# Patient Record
Sex: Male | Born: 1945 | Race: White | Hispanic: No | Marital: Married | Smoking: Former smoker
Health system: Southern US, Community
[De-identification: ages and names within clinical notes are randomized; demographics above are authoritative.]

## PROBLEM LIST (undated history)

## (undated) DIAGNOSIS — F32A Depression, unspecified: Secondary | ICD-10-CM

## (undated) DIAGNOSIS — K5790 Diverticulosis of intestine, part unspecified, without perforation or abscess without bleeding: Secondary | ICD-10-CM

## (undated) DIAGNOSIS — H269 Unspecified cataract: Secondary | ICD-10-CM

## (undated) DIAGNOSIS — E039 Hypothyroidism, unspecified: Secondary | ICD-10-CM

## (undated) DIAGNOSIS — E785 Hyperlipidemia, unspecified: Secondary | ICD-10-CM

## (undated) DIAGNOSIS — R911 Solitary pulmonary nodule: Secondary | ICD-10-CM

## (undated) DIAGNOSIS — M1990 Unspecified osteoarthritis, unspecified site: Secondary | ICD-10-CM

## (undated) DIAGNOSIS — K449 Diaphragmatic hernia without obstruction or gangrene: Secondary | ICD-10-CM

## (undated) DIAGNOSIS — G4730 Sleep apnea, unspecified: Secondary | ICD-10-CM

## (undated) DIAGNOSIS — K219 Gastro-esophageal reflux disease without esophagitis: Secondary | ICD-10-CM

## (undated) DIAGNOSIS — F4310 Post-traumatic stress disorder, unspecified: Secondary | ICD-10-CM

## (undated) DIAGNOSIS — Z77098 Contact with and (suspected) exposure to other hazardous, chiefly nonmedicinal, chemicals: Secondary | ICD-10-CM

## (undated) DIAGNOSIS — N419 Inflammatory disease of prostate, unspecified: Secondary | ICD-10-CM

## (undated) DIAGNOSIS — F329 Major depressive disorder, single episode, unspecified: Secondary | ICD-10-CM

## (undated) DIAGNOSIS — C49A0 Gastrointestinal stromal tumor, unspecified site: Secondary | ICD-10-CM

## (undated) DIAGNOSIS — E119 Type 2 diabetes mellitus without complications: Secondary | ICD-10-CM

## (undated) HISTORY — DX: Diverticulosis of intestine, part unspecified, without perforation or abscess without bleeding: K57.90

## (undated) HISTORY — DX: Sleep apnea, unspecified: G47.30

## (undated) HISTORY — DX: Diaphragmatic hernia without obstruction or gangrene: K44.9

## (undated) HISTORY — DX: Type 2 diabetes mellitus without complications: E11.9

## (undated) HISTORY — DX: Contact with and (suspected) exposure to other hazardous, chiefly nonmedicinal, chemicals: Z77.098

## (undated) HISTORY — DX: Unspecified cataract: H26.9

## (undated) HISTORY — DX: Hypothyroidism, unspecified: E03.9

## (undated) HISTORY — PX: APPENDECTOMY: SHX54

## (undated) HISTORY — DX: Gastro-esophageal reflux disease without esophagitis: K21.9

## (undated) HISTORY — DX: Post-traumatic stress disorder, unspecified: F43.10

## (undated) HISTORY — DX: Inflammatory disease of prostate, unspecified: N41.9

## (undated) HISTORY — DX: Gastrointestinal stromal tumor, unspecified site: C49.A0

## (undated) HISTORY — DX: Major depressive disorder, single episode, unspecified: F32.9

## (undated) HISTORY — DX: Unspecified osteoarthritis, unspecified site: M19.90

## (undated) HISTORY — DX: Hyperlipidemia, unspecified: E78.5

## (undated) HISTORY — DX: Depression, unspecified: F32.A

## (undated) HISTORY — DX: Solitary pulmonary nodule: R91.1

---

## 2000-09-09 ENCOUNTER — Encounter: Payer: Self-pay | Admitting: Emergency Medicine

## 2000-09-09 ENCOUNTER — Emergency Department (HOSPITAL_COMMUNITY): Admission: EM | Admit: 2000-09-09 | Discharge: 2000-09-09 | Payer: Self-pay | Admitting: Emergency Medicine

## 2013-06-10 HISTORY — PX: OTHER SURGICAL HISTORY: SHX169

## 2016-07-29 ENCOUNTER — Encounter: Payer: Self-pay | Admitting: Internal Medicine

## 2016-08-16 ENCOUNTER — Encounter: Payer: Self-pay | Admitting: *Deleted

## 2016-08-16 ENCOUNTER — Other Ambulatory Visit: Payer: Self-pay | Admitting: *Deleted

## 2016-08-26 ENCOUNTER — Ambulatory Visit: Payer: Self-pay | Admitting: Internal Medicine

## 2016-09-13 ENCOUNTER — Encounter: Payer: Self-pay | Admitting: *Deleted

## 2016-10-03 ENCOUNTER — Ambulatory Visit (INDEPENDENT_AMBULATORY_CARE_PROVIDER_SITE_OTHER): Payer: Medicare Other | Admitting: Internal Medicine

## 2016-10-03 ENCOUNTER — Encounter: Payer: Self-pay | Admitting: Internal Medicine

## 2016-10-03 VITALS — BP 110/80 | HR 95 | Ht 68.0 in | Wt 206.0 lb

## 2016-10-03 DIAGNOSIS — Z8509 Personal history of malignant neoplasm of other digestive organs: Secondary | ICD-10-CM | POA: Diagnosis not present

## 2016-10-03 DIAGNOSIS — R1013 Epigastric pain: Secondary | ICD-10-CM | POA: Diagnosis not present

## 2016-10-03 DIAGNOSIS — R1032 Left lower quadrant pain: Secondary | ICD-10-CM | POA: Diagnosis not present

## 2016-10-03 DIAGNOSIS — R112 Nausea with vomiting, unspecified: Secondary | ICD-10-CM

## 2016-10-03 MED ORDER — HYOSCYAMINE SULFATE SL 0.125 MG SL SUBL: SUBLINGUAL_TABLET | SUBLINGUAL | 2 refills | Status: DC

## 2016-10-03 NOTE — Progress Notes (Signed)
Patient ID: Ricardo Norton, male   DOB: [birth year], 71 y.o.   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 He is here today with his wife. He also has left lower quadrant discomfort that he wishes to discuss.  His GIST dx and surgery was performed in [place] at the [place].  he had a repeat upper endoscopy performed by Dr. Jannette Fogo at the [facility] in June 2017. This showed prior intervention in the stomach with suture material and a nodule which was biopsied. This showed focal chronic ulcer without tumor or atypia. There was mild chronic gastritis which was H. pylori negative.  He also has had a CT scan of the abdomen and pelvis with contrast performed in December by the [place]. This was with and without contrast dated 05/15/2016. This showed an unchanged suture margin in the stomach. There are multiple nondilated loops of small bowel and colon with no evidence of bowel obstruction or abnormal bowel thickening. There was diverticulosis doubt diverticulitis. There is no pathologic adenopathy.  Labs around that visit showed a normal AST, ALT. Hematocrit was 39.1, Hemoccult an A1c 7.6, hemoglobin 12.7, platelet count 245, total bili 0.3, white count 7.05  He reports that over the last 3-4 months he has developed epigastric abdominal pain and nausea. The nausea seems to be for no clear reason and seems to be worse in the morning and midday. Eating at times makes his nausea worse. He has rare vomiting. His epigastric pain seems unrelated to his nausea and is episodic. It is sharp starts quickly and last about 30 minutes. No aggravating or alleviating factors. The pain generally subsides on its own. Pain can occur daily for a week and then be gone for over a week. He  can also happen randomly 1 day a week. He takes omeprazole 20 mg twice a day before meals and ranitidine 150 mg twice a day. He has no heartburn or dysphagia. Separate from his epigastric pain and less significant for him is a left lower quadrant crampy type abdominal pain. This does not seem to associate with bowel movement. He does not have diarrhea, constipation, blood in his stool or melena. He reports this pain also lasted about 30 minutes seems to come and go and can be sharp. He denies change in bowel habit. He reports having had a colonoscopy 4-5 years ago in [place], [place] which she recalls being normal.  He denies a recent change in medication. Review of his medications from the [place] -- atorvastatin, bupropion, docusate, levothyroxine, lisinopril, metformin, paroxetine, primidone, baby aspirin and multivitamin. In addition the previously mentioned omeprazole and ranitidine.  Medical history also includes hypertension, hyperlipidemia, psoriasis, Agent Orange exposure, diabetes, depression, PTSD, osteoarthritis, atypical chest pain, hypothyroidism, history of lung nodules  Past Medical History:  Diagnosis Date  . Arthritis   . Cataract   . Depression   . Diabetes ([place])   . Diverticulosis   . Exposure to [employer]   . Gastrointestinal stromal tumor (GIST) ([place])   . GERD (gastroesophageal reflux disease)   . Hiatal hernia   . Hyperlipidemia   . Hypothyroid   . Prostatitis   . PTSD (post-traumatic stress disorder)   . Pulmonary nodule   . Sleep apnea  Past Surgical History:  Procedure Laterality Date  . APPENDECTOMY    . gist tumor excision- stomach  2015    No outpatient prescriptions prior to visit.   No facility-administered medications prior to visit.     No Known Allergies  Family History  Problem Relation Age of Onset  . Bladder Cancer Mother     Social History  Substance Use Topics  . Smoking status: Former Smoker    Quit date: 1997  .  Smokeless tobacco: Not on file  . Alcohol use Yes     Comment: rare    ROS: As per history of present illness, otherwise negative  BP 110/80   Pulse 95   Ht 5\' 8"  (1.727 m)   Wt 206 lb (93.4 kg)   BMI 31.32 kg/m  Constitutional: Well-developed and well-nourished. No distress. HEENT: Normocephalic and atraumatic. Oropharynx is clear and moist. Conjunctivae are normal.  No scleral icterus. Neck: Neck supple. Trachea midline. Cardiovascular: Normal rate, regular rhythm and intact distal pulses. No M/R/G Pulmonary/chest: Effort normal and breath sounds normal. No wheezing, rales or rhonchi. Abdominal: Soft, nontender, nondistended. Bowel sounds active throughout. There are no masses palpable. No hepatosplenomegaly. Extremities: no clubbing, cyanosis, or edema Neurological: Alert and oriented to person place and time. Skin: Skin is warm and dry.  Psychiatric: Normal mood and affect. Behavior is normal.  RELEVANT LABS AND IMAGING: See HPI  EGD report 11/24/2015 -- normal esophagus, small hiatal hernia, evidence of prior partial gastrectomy in the stomach. Single nodule adjacent to suture site. Multiple biopsies. Normal duodenum. Performed by Dr. Posey Pronto with Dr. 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  1. Hx of gastric body GIST (s/p surgical resection 2015)/hx of gastric nodule near gastric resection site/recent epigastric pain -- his epigastric pain has been present for 3 or 4 months and given his history I have recommended upper endoscopy. Given the nodule seen near the resection site and the possibility for recurrence I have recommended EUS. These test can likely be performed at the same time. I last my partner, Dr. Owens Loffler, to  review this case and consider EGD and EUS. We will contact the patient to arrange the studies. In the interim he will continue omeprazole 20 mg twice a day and ranitidine 150 mg twice a day. He has had cross-sectional imaging 4 months ago which did not show a cause for his upper abdominal pain.  --At this point I am not certain that his pain is related to his prior surgical intervention but this needs to be evaluated further.  2. Left lower quadrant pain -- intermittent. He has a history of diverticulosis. He reports being up-to-date with colonoscopy. We have requested these records from [facility]. Pain is not consistent with diverticulitis. This may be an irritable bowel type discomfort. I've asked that he keep a journal of these painful episodes, how frequently they occur, how long they last, and his bowel habits. We may find that he is having some constipation which needs to be treated. I will give him prescription for Levsin 0.125 mg 1-2 tablets sublingual every 4-6 hours as needed for crampy abdominal pain.  3. Nausea -- will treat with Zofran 4 mg every 6-8 hours as needed for nausea. Evaluating as discussed in #  1  Further recommendations after pending procedures     Cc: Loma [place], [place]

## 2016-10-03 NOTE — Patient Instructions (Signed)
We have sent the following medications to your pharmacy for you to pick up at your convenience: Levsin 1-2 tablets by mouth every 4-6 hours as needed for crampy abdominal pain  We will contact you regarding endoscopy and endoscopic ultrasound once Dr Hilarie Fredrickson and Dr Ardis Hughs have spoken and have come up with a time/date for procedure to be completed at the hospital.  We have requested records of your colonoscopy completed in 2013.  Please start keeping a journal about your upper and left lower quadrant abdominal pain. [place] often is it?  How long does it last? How does it relate to your bowel movements? Also make sure to chart bowel movements.  If you are age 11 or older, your body mass index should be between 23-30. Your Body mass index is 31.32 kg/m. If this is out of the aforementioned range listed, please consider follow up with your Primary Care Provider.  If you are age 72 or younger, your body mass index should be between 19-25. Your Body mass index is 31.32 kg/m. If this is out of the aformentioned range listed, please consider follow up with your Primary Care Provider.

## 2016-10-04 ENCOUNTER — Telehealth: Payer: Self-pay | Admitting: Internal Medicine

## 2016-10-04 ENCOUNTER — Telehealth: Payer: Self-pay

## 2016-10-04 MED ORDER — HYOSCYAMINE SULFATE SL 0.125 MG SL SUBL: SUBLINGUAL_TABLET | SUBLINGUAL | 2 refills | Status: DC

## 2016-10-04 NOTE — Telephone Encounter (Signed)
Rx sent 

## 2016-10-04 NOTE — Telephone Encounter (Signed)
-----   Message from Milus Banister, MD sent at 10/04/2016  7:48 AM EDT ----- Ulice Dash,  Reviewed your note.  I think EGD with EUS is a good next step.  Do you have his GIST surgery op note and associated path report?  I couldn't find in [facility] (it was at [facility]).  If not, I think it'll be helpful to have those records sent here for review; will help current exam in perspective.  Thanks  [employer], He needs upper EUS, radial +/- Linear.  Next available Thursday MAC appt for abd pain, h/o stomach tumor.  Thanks  dj ----- Message ----- From: Jerene Bears, MD Sent: 10/03/2016  12:32 PM To: Milus Banister, MD  Dan,  Please see my note for details, but this is a patient with epigastric pain with history of GIST. Could you help by performing EGD and EUS?  He had GIST resected, but then nodule near suture site in stomach seen 11/2015.  Bx only was unremarkable, but not sure deep enough. Thanks for your help.

## 2016-10-09 ENCOUNTER — Other Ambulatory Visit: Payer: Self-pay

## 2016-10-09 DIAGNOSIS — R109 Unspecified abdominal pain: Secondary | ICD-10-CM

## 2016-10-09 DIAGNOSIS — Z85020 Personal history of malignant carcinoid tumor of stomach: Secondary | ICD-10-CM

## 2016-10-09 NOTE — Telephone Encounter (Signed)
Pt has been scheduled for an EUS on 10/17/16 at 930 am [facility]

## 2016-10-09 NOTE — Telephone Encounter (Signed)
EUS scheduled, pt instructed and medications reviewed.  Patient instructions mailed to home.  Patient to call with any questions or concerns.  

## 2016-10-17 ENCOUNTER — Ambulatory Visit (HOSPITAL_COMMUNITY): Payer: Non-veteran care | Admitting: Anesthesiology

## 2016-10-17 ENCOUNTER — Ambulatory Visit (HOSPITAL_COMMUNITY)
Admission: RE | Admit: 2016-10-17 | Discharge: 2016-10-17 | Disposition: A | Discharge status: Home / Self Care | Payer: Non-veteran care | Source: Ambulatory Visit | Attending: Gastroenterology | Admitting: Gastroenterology

## 2016-10-17 ENCOUNTER — Encounter (HOSPITAL_COMMUNITY): Payer: Self-pay

## 2016-10-17 ENCOUNTER — Encounter (HOSPITAL_COMMUNITY)
Admission: RE | Disposition: A | Discharge status: Home / Self Care | Payer: Self-pay | Source: Ambulatory Visit | Attending: Gastroenterology

## 2016-10-17 DIAGNOSIS — F4310 Post-traumatic stress disorder, unspecified: Secondary | ICD-10-CM | POA: Diagnosis not present

## 2016-10-17 DIAGNOSIS — R918 Other nonspecific abnormal finding of lung field: Secondary | ICD-10-CM | POA: Insufficient documentation

## 2016-10-17 DIAGNOSIS — X58XXXS Exposure to other specified factors, sequela: Secondary | ICD-10-CM | POA: Diagnosis not present

## 2016-10-17 DIAGNOSIS — K2950 Unspecified chronic gastritis without bleeding: Secondary | ICD-10-CM | POA: Insufficient documentation

## 2016-10-17 DIAGNOSIS — K5900 Constipation, unspecified: Secondary | ICD-10-CM | POA: Insufficient documentation

## 2016-10-17 DIAGNOSIS — L409 Psoriasis, unspecified: Secondary | ICD-10-CM | POA: Insufficient documentation

## 2016-10-17 DIAGNOSIS — K449 Diaphragmatic hernia without obstruction or gangrene: Secondary | ICD-10-CM | POA: Insufficient documentation

## 2016-10-17 DIAGNOSIS — K2990 Gastroduodenitis, unspecified, without bleeding: Secondary | ICD-10-CM

## 2016-10-17 DIAGNOSIS — F329 Major depressive disorder, single episode, unspecified: Secondary | ICD-10-CM | POA: Diagnosis not present

## 2016-10-17 DIAGNOSIS — Z8052 Family history of malignant neoplasm of bladder: Secondary | ICD-10-CM | POA: Diagnosis not present

## 2016-10-17 DIAGNOSIS — K3189 Other diseases of stomach and duodenum: Secondary | ICD-10-CM | POA: Insufficient documentation

## 2016-10-17 DIAGNOSIS — G4730 Sleep apnea, unspecified: Secondary | ICD-10-CM | POA: Diagnosis not present

## 2016-10-17 DIAGNOSIS — E039 Hypothyroidism, unspecified: Secondary | ICD-10-CM | POA: Insufficient documentation

## 2016-10-17 DIAGNOSIS — Z87891 Personal history of nicotine dependence: Secondary | ICD-10-CM | POA: Diagnosis not present

## 2016-10-17 DIAGNOSIS — E119 Type 2 diabetes mellitus without complications: Secondary | ICD-10-CM | POA: Diagnosis not present

## 2016-10-17 DIAGNOSIS — Z8509 Personal history of malignant neoplasm of other digestive organs: Secondary | ICD-10-CM | POA: Diagnosis not present

## 2016-10-17 DIAGNOSIS — K222 Esophageal obstruction: Secondary | ICD-10-CM | POA: Insufficient documentation

## 2016-10-17 DIAGNOSIS — K2970 Gastritis, unspecified, without bleeding: Secondary | ICD-10-CM

## 2016-10-17 DIAGNOSIS — Z980 Intestinal bypass and anastomosis status: Secondary | ICD-10-CM | POA: Diagnosis not present

## 2016-10-17 DIAGNOSIS — R109 Unspecified abdominal pain: Secondary | ICD-10-CM | POA: Diagnosis not present

## 2016-10-17 DIAGNOSIS — K219 Gastro-esophageal reflux disease without esophagitis: Secondary | ICD-10-CM | POA: Insufficient documentation

## 2016-10-17 DIAGNOSIS — E785 Hyperlipidemia, unspecified: Secondary | ICD-10-CM | POA: Insufficient documentation

## 2016-10-17 DIAGNOSIS — M1990 Unspecified osteoarthritis, unspecified site: Secondary | ICD-10-CM | POA: Insufficient documentation

## 2016-10-17 DIAGNOSIS — R1013 Epigastric pain: Secondary | ICD-10-CM | POA: Diagnosis present

## 2016-10-17 DIAGNOSIS — R0789 Other chest pain: Secondary | ICD-10-CM | POA: Insufficient documentation

## 2016-10-17 DIAGNOSIS — I10 Essential (primary) hypertension: Secondary | ICD-10-CM | POA: Insufficient documentation

## 2016-10-17 DIAGNOSIS — Z85020 Personal history of malignant carcinoid tumor of stomach: Secondary | ICD-10-CM

## 2016-10-17 HISTORY — PX: EUS: SHX5427

## 2016-10-17 LAB — GLUCOSE, CAPILLARY: Glucose-Capillary: 126 mg/dL — ABNORMAL HIGH (ref 65–99)

## 2016-10-17 SURGERY — UPPER ENDOSCOPIC ULTRASOUND (EUS) LINEAR
Anesthesia: Monitor Anesthesia Care

## 2016-10-17 MED ORDER — PROPOFOL 10 MG/ML IV BOLUS
INTRAVENOUS | Status: AC
  Filled 2016-10-17: qty 20

## 2016-10-17 MED ORDER — PROPOFOL 500 MG/50ML IV EMUL
INTRAVENOUS | Status: DC | PRN
  Administered 2016-10-17: 50 mg via INTRAVENOUS

## 2016-10-17 MED ORDER — SODIUM CHLORIDE 0.9 % IV SOLN: INTRAVENOUS | Status: DC

## 2016-10-17 MED ORDER — PROPOFOL 500 MG/50ML IV EMUL
INTRAVENOUS | Status: DC | PRN
  Administered 2016-10-17: 100 ug/kg/min via INTRAVENOUS

## 2016-10-17 MED ORDER — LACTATED RINGERS IV SOLN
INTRAVENOUS | Status: DC | PRN
  Administered 2016-10-17: 09:00:00 via INTRAVENOUS

## 2016-10-17 NOTE — Interval H&P Note (Signed)
History and Physical Interval Note:  10/17/2016 9:06 AM  Ricardo Norton  has presented today for surgery, with the diagnosis of abd pain, hx of stomach tumor   The various methods of treatment have been discussed with the patient and family. After consideration of risks, benefits and other options for treatment, the patient has consented to  Procedure(s): UPPER ENDOSCOPIC ULTRASOUND (EUS) LINEAR (N/A) as a surgical intervention .  The patient's history has been reviewed, patient examined, no change in status, stable for surgery.  I have reviewed the patient's chart and labs.  Questions were answered to the patient's satisfaction.     Milus Banister

## 2016-10-17 NOTE — H&P (View-Only) (Signed)
Patient ID: GRAY DOERING, male   DOB: 05-02-1946, 71 y.o.   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 He is here today with his wife. He also has left lower quadrant discomfort that he wishes to discuss.  His GIST dx and surgery was performed in [place] at the [place].  he had a repeat upper endoscopy performed by Dr. Jannette Fogo at the [facility] in June 2017. This showed prior intervention in the stomach with suture material and a nodule which was biopsied. This showed focal chronic ulcer without tumor or atypia. There was mild chronic gastritis which was H. pylori negative.  He also has had a CT scan of the abdomen and pelvis with contrast performed in December by the [place]. This was with and without contrast dated 05/15/2016. This showed an unchanged suture margin in the stomach. There are multiple nondilated loops of small bowel and colon with no evidence of bowel obstruction or abnormal bowel thickening. There was diverticulosis doubt diverticulitis. There is no pathologic adenopathy.  Labs around that visit showed a normal AST, ALT. Hematocrit was 39.1, Hemoccult an A1c 7.6, hemoglobin 12.7, platelet count 245, total bili 0.3, white count 7.05  He reports that over the last 3-4 months he has developed epigastric abdominal pain and nausea. The nausea seems to be for no clear reason and seems to be worse in the morning and midday. Eating at times makes his nausea worse. He has rare vomiting. His epigastric pain seems unrelated to his nausea and is episodic. It is sharp starts quickly and last about 30 minutes. No aggravating or alleviating factors. The pain generally subsides on its own. Pain can occur daily for a week and then be gone for over a week. He  can also happen randomly 1 day a week. He takes omeprazole 20 mg twice a day before meals and ranitidine 150 mg twice a day. He has no heartburn or dysphagia. Separate from his epigastric pain and less significant for him is a left lower quadrant crampy type abdominal pain. This does not seem to associate with bowel movement. He does not have diarrhea, constipation, blood in his stool or melena. He reports this pain also lasted about 30 minutes seems to come and go and can be sharp. He denies change in bowel habit. He reports having had a colonoscopy 4-5 years ago in [place], [place] which she recalls being normal.  He denies a recent change in medication. Review of his medications from the [place] -- atorvastatin, bupropion, docusate, levothyroxine, lisinopril, metformin, paroxetine, primidone, baby aspirin and multivitamin. In addition the previously mentioned omeprazole and ranitidine.  Medical history also includes hypertension, hyperlipidemia, psoriasis, Agent Orange exposure, diabetes, depression, PTSD, osteoarthritis, atypical chest pain, hypothyroidism, history of lung nodules  Past Medical History:  Diagnosis Date  . Arthritis   . Cataract   . Depression   . Diabetes ([place])   . Diverticulosis   . Exposure to [employer]   . Gastrointestinal stromal tumor (GIST) ([place])   . GERD (gastroesophageal reflux disease)   . Hiatal hernia   . Hyperlipidemia   . Hypothyroid   . Prostatitis   . PTSD (post-traumatic stress disorder)   . Pulmonary nodule   . Sleep apnea  Past Surgical History:  Procedure Laterality Date  . APPENDECTOMY    . gist tumor excision- stomach  2015    No outpatient prescriptions prior to visit.   No facility-administered medications prior to visit.     No Known Allergies  Family History  Problem Relation Age of Onset  . Bladder Cancer Mother     Social History  Substance Use Topics  . Smoking status: Former Smoker    Quit date: 1997  .  Smokeless tobacco: Not on file  . Alcohol use Yes     Comment: rare    ROS: As per history of present illness, otherwise negative  BP 110/80   Pulse 95   Ht 5\' 8"  (1.727 m)   Wt 206 lb (93.4 kg)   BMI 31.32 kg/m  Constitutional: Well-developed and well-nourished. No distress. HEENT: Normocephalic and atraumatic. Oropharynx is clear and moist. Conjunctivae are normal.  No scleral icterus. Neck: Neck supple. Trachea midline. Cardiovascular: Normal rate, regular rhythm and intact distal pulses. No M/R/G Pulmonary/chest: Effort normal and breath sounds normal. No wheezing, rales or rhonchi. Abdominal: Soft, nontender, nondistended. Bowel sounds active throughout. There are no masses palpable. No hepatosplenomegaly. Extremities: no clubbing, cyanosis, or edema Neurological: Alert and oriented to person place and time. Skin: Skin is warm and dry.  Psychiatric: Normal mood and affect. Behavior is normal.  RELEVANT LABS AND IMAGING: See HPI  EGD report 11/24/2015 -- normal esophagus, small hiatal hernia, evidence of prior partial gastrectomy in the stomach. Single nodule adjacent to suture site. Multiple biopsies. Normal duodenum. Performed by Dr. Posey Pronto with Dr. 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  1. Hx of gastric body GIST (s/p surgical resection 2015)/hx of gastric nodule near gastric resection site/recent epigastric pain -- his epigastric pain has been present for 3 or 4 months and given his history I have recommended upper endoscopy. Given the nodule seen near the resection site and the possibility for recurrence I have recommended EUS. These test can likely be performed at the same time. I last my partner, Dr. Owens Loffler, to  review this case and consider EGD and EUS. We will contact the patient to arrange the studies. In the interim he will continue omeprazole 20 mg twice a day and ranitidine 150 mg twice a day. He has had cross-sectional imaging 4 months ago which did not show a cause for his upper abdominal pain.  --At this point I am not certain that his pain is related to his prior surgical intervention but this needs to be evaluated further.  2. Left lower quadrant pain -- intermittent. He has a history of diverticulosis. He reports being up-to-date with colonoscopy. We have requested these records from [facility]. Pain is not consistent with diverticulitis. This may be an irritable bowel type discomfort. I've asked that he keep a journal of these painful episodes, how frequently they occur, how long they last, and his bowel habits. We may find that he is having some constipation which needs to be treated. I will give him prescription for Levsin 0.125 mg 1-2 tablets sublingual every 4-6 hours as needed for crampy abdominal pain.  3. Nausea -- will treat with Zofran 4 mg every 6-8 hours as needed for nausea. Evaluating as discussed in #  1  Further recommendations after pending procedures     Cc: Loma Boston, [place]

## 2016-10-17 NOTE — Anesthesia Preprocedure Evaluation (Addendum)
Anesthesia Evaluation  Patient identified by MRN, date of birth, ID band Patient awake    Reviewed: Allergy & Precautions, NPO status , Patient's Chart, lab work & pertinent test results  Airway Mallampati: II  TM Distance: >3 FB Neck ROM: Full    Dental no notable dental hx.    Pulmonary neg pulmonary ROS, former smoker,    Pulmonary exam normal breath sounds clear to auscultation       Cardiovascular negative cardio ROS Normal cardiovascular exam Rhythm:Regular Rate:Normal     Neuro/Psych negative neurological ROS  negative psych ROS   GI/Hepatic Neg liver ROS, GERD  ,  Endo/Other  diabetesHypothyroidism   Renal/GU negative Renal ROS  negative genitourinary   Musculoskeletal negative musculoskeletal ROS (+)   Abdominal   Peds negative pediatric ROS (+)  Hematology negative hematology ROS (+)   Anesthesia Other Findings   Reproductive/Obstetrics negative OB ROS                             Anesthesia Physical Anesthesia Plan  ASA: II  Anesthesia Plan: MAC   Post-op Pain Management:    Induction: Intravenous  Airway Management Planned: Nasal Cannula  Additional Equipment:   Intra-op Plan:   Post-operative Plan:   Informed Consent: I have reviewed the patients History and Physical, chart, labs and discussed the procedure including the risks, benefits and alternatives for the proposed anesthesia with the patient or authorized representative who has indicated his/her understanding and acceptance.   Dental advisory given  Plan Discussed with: CRNA and Surgeon  Anesthesia Plan Comments:         Anesthesia Quick Evaluation

## 2016-10-17 NOTE — Discharge Instructions (Signed)

## 2016-10-17 NOTE — Transfer of Care (Signed)
Immediate Anesthesia Transfer of Care Note  Patient: Ricardo Norton  Procedure(s) Performed: Procedure(s): UPPER ENDOSCOPIC ULTRASOUND (EUS) LINEAR (N/A)  Patient Location: PACU  Anesthesia Type:MAC  Level of Consciousness: awake, alert  and oriented  Airway & Oxygen Therapy: Patient Spontanous Breathing and Patient connected to nasal cannula oxygen  Post-op Assessment: Report given to RN and Post -op Vital signs reviewed and stable  Post vital signs: Reviewed and stable  Last Vitals:  Vitals:   10/17/16 0859  BP: (!) 144/75  Pulse: 75  Resp: 13  Temp: 37.1 C    Last Pain:  Vitals:   10/17/16 0859  TempSrc: Oral         Complications: No apparent anesthesia complications

## 2016-10-17 NOTE — Op Note (Signed)
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 Estimated blood loss:                            None. Estimated Blood Loss:     Estimated blood loss: none. Procedure:                Pre-Anesthesia Assessment:                           - Prior to the procedure, a History and Physical                            was performed, and patient medications and                            allergies were reviewed. The patient's tolerance of                            previous anesthesia was also reviewed. The risks                            and benefits of the procedure and the sedation                            options and risks were discussed with the patient.                            All questions were answered, and informed consent  was obtained. Prior Anticoagulants: The patient has                            taken no previous anticoagulant or antiplatelet                            agents. ASA Grade Assessment: II - A patient with                            mild systemic disease. After reviewing the risks                            and benefits, the patient was deemed in   satisfactory condition to undergo the procedure.                           After obtaining informed consent, the endoscope was                            passed under direct vision. Throughout the                            procedure, the patient's blood pressure, pulse, and                            oxygen saturations were monitored continuously. The                            was introduced through the mouth, and advanced to                            the second part of duodenum. The [id number] ([id number])                            scope was introduced through the mouth, and                            advanced to the second part of duodenum. The upper                            EUS was accomplished without difficulty. The                            patient tolerated the procedure well. Scope In: Scope Out: Findings:      Endoscopic Finding :      1. Previous distal gastric wedge resection site was clearly located.       There was a medium sized retained suture at the site. The anastomosis       was slighly inflamed, nodular. This appeared consistent with post       surgical granulation tissue, edema but biopsies were taken with forceps.       The stomach was otherwise normal.      2. Minor, non-obstructing mucosal Schatzki's ring at the GE junction       above  a small hiatal hernia.      3. Normal duodenum      Endosonographic Finding :      1. The gastric wall showed some anatomic distortion related to previous       distal gastric wedge resection but the echolayering was otherwise       normal. No discrete gastric wall masses to suggest GIST recurrence.      2. Limited views of the liver, spleen, pancreas, portal vessels were all       normal Impression:               - Retained suture, and typical appearing post                            operative granulation tissue, edema at the site of                            previous gastric wedge resection. The mucosa was                             biopsied with forceps.                           - Gastric wall echolayering shows no sign of                            recurrent GIST lesion.                           - Minor Schatzki's ring above a small hiatal hernia. Moderate Sedation:      N/A- Per Anesthesia Care Recommendation:           - Discharge patient to home (ambulatory).                           - Await final pathology. Procedure Code(s):        --- Professional ---                           [phone], Esophagogastroduodenoscopy, flexible,                            transoral; with endoscopic ultrasound examination                            limited to the esophagus, stomach or duodenum, and                            adjacent structures Diagnosis Code(s):        --- Professional ---                           K31.89, Other diseases of stomach and duodenum                           R10.13, Epigastric pain CPT copyright 2016 American Medical Association. All rights reserved. The codes documented in this report are preliminary and upon  coder review may  be revised to meet current compliance requirements. Milus Banister, MD 10/17/2016 9:55:23 AM This report has been signed electronically. Number of Addenda: 0

## 2016-10-17 NOTE — Anesthesia Postprocedure Evaluation (Signed)
Anesthesia Post Note  Patient: Ricardo Norton  Procedure(Norton) Performed: Procedure(Norton) (LRB): UPPER ENDOSCOPIC ULTRASOUND (EUS) LINEAR (N/A)  Patient location during evaluation: PACU Anesthesia Type: MAC Level of consciousness: awake and alert Pain management: pain level controlled Vital Signs Assessment: post-procedure vital signs reviewed and stable Respiratory status: spontaneous breathing, nonlabored ventilation, respiratory function stable and patient connected to nasal cannula oxygen Cardiovascular status: stable and blood pressure returned to baseline Anesthetic complications: no       Last Vitals:  Vitals:   10/17/16 1005 10/17/16 1010  BP:  123/78  Pulse: 70 67  Resp: 14 13  Temp:      Last Pain:  Vitals:   10/17/16 0951  TempSrc: Oral                 Ricardo Norton

## 2016-10-18 ENCOUNTER — Encounter (HOSPITAL_COMMUNITY): Payer: Self-pay | Admitting: Gastroenterology

## 2016-10-22 ENCOUNTER — Telehealth: Payer: Self-pay | Admitting: Internal Medicine

## 2016-10-22 NOTE — Telephone Encounter (Signed)
See surg path result note from 10/17/16

## 2016-10-28 NOTE — Progress Notes (Signed)
Colonoscopy report received from [facility]- Colonoscopy 11/30/12 Normal Exam Recall 11/2022

## 2016-11-11 NOTE — Anesthesia Postprocedure Evaluation (Signed)
Anesthesia Post Note  Patient: Ricardo Norton  Procedure(s) Performed: Procedure(s) (LRB): UPPER ENDOSCOPIC ULTRASOUND (EUS) LINEAR (N/A)     Anesthesia Post Evaluation  Last Vitals:  Vitals:   10/17/16 1005 10/17/16 1010  BP:  123/78  Pulse: 70 67  Resp: 14 13  Temp:      Last Pain:  Vitals:   10/17/16 0951  TempSrc: Oral                 Vesta Wheeland S

## 2016-11-11 NOTE — Addendum Note (Signed)
Addendum  created 11/11/16 1434 by Myrtie Soman, MD   Sign clinical note

## 2018-02-03 ENCOUNTER — Other Ambulatory Visit: Payer: Self-pay

## 2018-02-03 ENCOUNTER — Emergency Department (HOSPITAL_BASED_OUTPATIENT_CLINIC_OR_DEPARTMENT_OTHER)
Admission: EM | Admit: 2018-02-03 | Discharge: 2018-02-03 | Disposition: A | Discharge status: Home / Self Care | Payer: Medicare Other | Attending: Emergency Medicine | Admitting: Emergency Medicine

## 2018-02-03 ENCOUNTER — Encounter (HOSPITAL_BASED_OUTPATIENT_CLINIC_OR_DEPARTMENT_OTHER): Payer: Self-pay | Admitting: *Deleted

## 2018-02-03 DIAGNOSIS — L509 Urticaria, unspecified: Secondary | ICD-10-CM | POA: Insufficient documentation

## 2018-02-03 DIAGNOSIS — Z79899 Other long term (current) drug therapy: Secondary | ICD-10-CM | POA: Diagnosis not present

## 2018-02-03 DIAGNOSIS — E039 Hypothyroidism, unspecified: Secondary | ICD-10-CM | POA: Insufficient documentation

## 2018-02-03 DIAGNOSIS — Z87891 Personal history of nicotine dependence: Secondary | ICD-10-CM | POA: Insufficient documentation

## 2018-02-03 DIAGNOSIS — Z7984 Long term (current) use of oral hypoglycemic drugs: Secondary | ICD-10-CM | POA: Insufficient documentation

## 2018-02-03 DIAGNOSIS — E119 Type 2 diabetes mellitus without complications: Secondary | ICD-10-CM | POA: Insufficient documentation

## 2018-02-03 MED ORDER — FAMOTIDINE 20 MG PO TABS: 20.0000 mg | ORAL_TABLET | Freq: Two times a day (BID) | ORAL | 0 refills | Status: DC

## 2018-02-03 MED ORDER — DEXAMETHASONE SODIUM PHOSPHATE 10 MG/ML IJ SOLN
10.0000 mg | Freq: Once | INTRAMUSCULAR | Status: AC
  Administered 2018-02-03: 10 mg via INTRAMUSCULAR
  Filled 2018-02-03: qty 1

## 2018-02-03 MED ORDER — HYDROXYZINE HCL 25 MG PO TABS: 25.0000 mg | ORAL_TABLET | Freq: Three times a day (TID) | ORAL | 0 refills | Status: DC | PRN

## 2018-02-03 MED ORDER — CETIRIZINE HCL 10 MG PO TABS: 10.0000 mg | ORAL_TABLET | Freq: Every day | ORAL | 0 refills | Status: DC

## 2018-02-03 MED ORDER — HYDROXYZINE HCL 25 MG PO TABS
25.0000 mg | ORAL_TABLET | Freq: Once | ORAL | Status: AC
  Administered 2018-02-03: 25 mg via ORAL
  Filled 2018-02-03: qty 1

## 2018-02-03 MED ORDER — FAMOTIDINE 20 MG PO TABS
20.0000 mg | ORAL_TABLET | Freq: Once | ORAL | Status: AC
  Administered 2018-02-03: 20 mg via ORAL
  Filled 2018-02-03: qty 1

## 2018-02-03 NOTE — ED Triage Notes (Signed)
Itching all over since last night. No hives or redness. Feels like his throat is closing this am. Oxygen saturation is 100%. He is speaking in complete sentences. States he was stung by a bee a week ago.

## 2018-02-03 NOTE — ED Provider Notes (Signed)
[facility] EMERGENCY DEPARTMENT Provider Note   CSN: [id number] Arrival date & time: 02/03/18  1334     History   Chief Complaint Chief Complaint  Patient presents with  . Urticaria    HPI Ricardo Norton is a 72 y.o. male.  Patient reports sudden onset of itching all over last night. No new medications, foods, soaps. Today he reports feeling short of breath and throat tightness. No nausea or vomiting. He does have a diffuse red papular rash on his chest, back, and shoulders.  The history is provided by the patient.  Urticaria  This is a new problem. The current episode started yesterday. The problem occurs constantly. The problem has been gradually worsening. Associated symptoms include shortness of breath. Pertinent negatives include no abdominal pain. He has tried nothing for the symptoms.    Past Medical History:  Diagnosis Date  . Arthritis   . Cataract   . Depression   . Diabetes ([place])   . Diverticulosis   . Exposure to [employer]   . Gastrointestinal stromal tumor (GIST) ([place])   . GERD (gastroesophageal reflux disease)   . Hiatal hernia   . Hyperlipidemia   . Hypothyroid   . Prostatitis   . PTSD (post-traumatic stress disorder)   . Pulmonary nodule   . Sleep apnea     Patient Active Problem List   Diagnosis Date Noted  . Abdominal pain   . History of gastrointestinal stromal tumor (GIST)   . Gastritis and gastroduodenitis     Past Surgical History:  Procedure Laterality Date  . APPENDECTOMY    . EUS N/A 10/17/2016   Procedure: UPPER ENDOSCOPIC ULTRASOUND (EUS) LINEAR;  Surgeon: Milus Banister, MD;  Location: [facility] ENDOSCOPY;  Service: Endoscopy;  Laterality: N/A;  . gist tumor excision- stomach  2015        Home Medications    Prior to Admission medications   Medication Sig Start Date End Date Taking? Authorizing Provider  atorvastatin (LIPITOR) 80 MG tablet Take 40 mg by mouth at bedtime.   Yes [provider]  buPROPion  (WELLBUTRIN SR) 150 MG 12 hr tablet Take 300 mg by mouth daily.   Yes [provider]  levothyroxine (SYNTHROID, LEVOTHROID) 25 MCG tablet Take 25 mcg by mouth at bedtime.   Yes [provider]  lisinopril (PRINIVIL,ZESTRIL) 40 MG tablet Take 20 mg by mouth at bedtime.   Yes [provider]  metFORMIN (GLUCOPHAGE) 500 MG tablet Take 500 mg by mouth 2 (two) times daily with a meal.   Yes [provider]  Multiple Vitamin (MULTIVITAMIN WITH MINERALS) TABS tablet Take 1 tablet by mouth daily.   Yes [provider]  omeprazole (PRILOSEC) 20 MG capsule Take 20 mg by mouth 2 (two) times daily.   Yes [provider]  PARoxetine (PAXIL) 40 MG tablet Take 40 mg by mouth daily.   Yes [provider]  primidone (MYSOLINE) 50 MG tablet Take 150 mg by mouth daily.   Yes [provider]  ranitidine (ZANTAC) 150 MG tablet Take 150 mg by mouth 2 (two) times daily.   Yes [provider]  Hyoscyamine Sulfate SL (LEVSIN/SL) 0.125 MG SUBL Take 1-2 tablets by mouth every 4-6 hours as needed 10/04/16   Pyrtle, Lajuan Lines, MD  Omega-3 Fatty Acids (OMEGA-3 PO) Take 2 capsules by mouth at bedtime.    [provider]    Family History Family History  Problem Relation Age of Onset  .  Bladder Cancer Mother     Social History Social History   Tobacco Use  . Smoking status: Former Smoker    Last attempt to quit: 1997    Years since quitting: 22.6  . Smokeless tobacco: Never Used  Substance Use Topics  . Alcohol use: Yes    Comment: rare  . Drug use: Not on file     Allergies   Patient has no known allergies.   Review of Systems Review of Systems  Respiratory: Positive for shortness of breath.   Gastrointestinal: Negative for abdominal pain.  Skin: Positive for rash.  All other systems reviewed and are negative.    Physical Exam Updated Vital Signs BP (!) 132/94   Pulse (!) 102   Temp 97.9 F (36.6 C) (Oral)    Resp 20   Ht 5\' 8"  (1.727 m)   Wt 93.9 kg   SpO2 98%   BMI 31.47 kg/m   Physical Exam  Constitutional: He is oriented to person, place, and time. He appears well-developed and well-nourished. No distress.  HENT:  Head: Atraumatic.  Eyes: Conjunctivae are normal.  Neck: Neck supple.  Cardiovascular: Normal rate and regular rhythm.  Pulmonary/Chest: Effort normal and breath sounds normal.  Abdominal: Soft.  Musculoskeletal: Normal range of motion. He exhibits no edema.  Neurological: He is alert and oriented to person, place, and time.  Skin: Rash noted. There is erythema.  Psychiatric: He has a normal mood and affect.  Nursing note and vitals reviewed.    ED Treatments / Results  Labs (all labs ordered are listed, but only abnormal results are displayed) Labs Reviewed - No data to display  EKG None  Radiology No results found.  Procedures Procedures (including critical care time)  Medications Ordered in ED Medications  famotidine (PEPCID) tablet 20 mg (20 mg Oral Given 02/03/18 1439)  dexamethasone (DECADRON) injection 10 mg (10 mg Intramuscular Given 02/03/18 1439)  hydrOXYzine (ATARAX/VISTARIL) tablet 25 mg (25 mg Oral Given 02/03/18 1438)    Symptoms improved after medication. Initial Impression / Assessment and Plan / ED Course  I have reviewed the triage vital signs and the nursing notes.  Pertinent labs & imaging results that were available during my care of the patient were reviewed by me and considered in my medical decision making (see chart for details).    Patient discussed with and seen by Dr. Ralene Bathe.  Patient with urticarial eruption. No specific trigger. Discussed that urticaria can be trigger by stress heat cold and for unknown reasons. No signs of anaphylactic reaction; no new medications. Will treat with atarax, zyrtec and pepcid. Patient received 10 mg decadron IM in ED. Follow up with PCP in 2-3 days. Return precautions discussed. Pt is safe for  discharge at this time.      Final Clinical Impressions(s) / ED Diagnoses   Final diagnoses:  Urticaria    ED Discharge Orders    None       Etta Quill, NP 02/03/18 4696    Margette Fast, MD 02/13/18 [phone]

## 2018-03-01 ENCOUNTER — Emergency Department (HOSPITAL_BASED_OUTPATIENT_CLINIC_OR_DEPARTMENT_OTHER)
Admission: EM | Admit: 2018-03-01 | Discharge: 2018-03-01 | Disposition: A | Discharge status: Home / Self Care | Payer: Medicare Other | Attending: Emergency Medicine | Admitting: Emergency Medicine

## 2018-03-01 ENCOUNTER — Other Ambulatory Visit: Payer: Self-pay

## 2018-03-01 ENCOUNTER — Encounter (HOSPITAL_BASED_OUTPATIENT_CLINIC_OR_DEPARTMENT_OTHER): Payer: Self-pay | Admitting: Emergency Medicine

## 2018-03-01 DIAGNOSIS — L03115 Cellulitis of right lower limb: Secondary | ICD-10-CM | POA: Insufficient documentation

## 2018-03-01 DIAGNOSIS — Z79899 Other long term (current) drug therapy: Secondary | ICD-10-CM | POA: Insufficient documentation

## 2018-03-01 DIAGNOSIS — Z23 Encounter for immunization: Secondary | ICD-10-CM | POA: Diagnosis not present

## 2018-03-01 DIAGNOSIS — E119 Type 2 diabetes mellitus without complications: Secondary | ICD-10-CM | POA: Diagnosis not present

## 2018-03-01 DIAGNOSIS — L0390 Cellulitis, unspecified: Secondary | ICD-10-CM

## 2018-03-01 DIAGNOSIS — I10 Essential (primary) hypertension: Secondary | ICD-10-CM | POA: Diagnosis not present

## 2018-03-01 DIAGNOSIS — M79661 Pain in right lower leg: Secondary | ICD-10-CM | POA: Diagnosis present

## 2018-03-01 DIAGNOSIS — Z87891 Personal history of nicotine dependence: Secondary | ICD-10-CM | POA: Diagnosis not present

## 2018-03-01 DIAGNOSIS — Z7984 Long term (current) use of oral hypoglycemic drugs: Secondary | ICD-10-CM | POA: Insufficient documentation

## 2018-03-01 LAB — CBG MONITORING, ED: GLUCOSE-CAPILLARY: 218 mg/dL — AB (ref 70–99)

## 2018-03-01 MED ORDER — CEPHALEXIN 500 MG PO CAPS: 500.0000 mg | ORAL_CAPSULE | Freq: Four times a day (QID) | ORAL | 0 refills | Status: DC

## 2018-03-01 MED ORDER — TETANUS-DIPHTH-ACELL PERTUSSIS 5-2.5-18.5 LF-MCG/0.5 IM SUSP
0.5000 mL | Freq: Once | INTRAMUSCULAR | Status: AC
Start: 2018-03-01 — End: 2018-03-01
  Administered 2018-03-01: 0.5 mL via INTRAMUSCULAR
  Filled 2018-03-01: qty 0.5

## 2018-03-01 MED ORDER — CEPHALEXIN 250 MG PO CAPS
500.0000 mg | ORAL_CAPSULE | Freq: Once | ORAL | Status: AC
  Administered 2018-03-01: 500 mg via ORAL
  Filled 2018-03-01: qty 2

## 2018-03-01 NOTE — ED Provider Notes (Signed)
[facility] EMERGENCY DEPARTMENT Provider Note   CSN: [id number] Arrival date & time: 03/01/18  1302     History   Chief Complaint Chief Complaint  Patient presents with  . Leg Pain    HPI Ricardo Norton is a 72 y.o. male.  Patient is a 72 year old male with a history of diabetes, hypertension and depression who is presenting today with a wound to his right lower leg.  Patient states this occurred approximately 4 days ago when he was moving a metal shelf and it fell scraping his right lower tib-fib area.  He has been cleaning the area with soap and water and placing Neosporin.  However last night he started having worsening pain of the leg and this morning noticed redness.  May be a small amount of white drainage today but he denies any fever or systemic symptoms.  The history is provided by the patient and the spouse.    Past Medical History:  Diagnosis Date  . Arthritis   . Cataract   . Depression   . Diabetes ([employer])   . Diverticulosis   . Exposure to [employer]   . Gastrointestinal stromal tumor (GIST) ([place])   . GERD (gastroesophageal reflux disease)   . Hiatal hernia   . Hyperlipidemia   . Hypothyroid   . Prostatitis   . PTSD (post-traumatic stress disorder)   . Pulmonary nodule   . Sleep apnea     Patient Active Problem List   Diagnosis Date Noted  . Abdominal pain   . History of gastrointestinal stromal tumor (GIST)   . Gastritis and gastroduodenitis     Past Surgical History:  Procedure Laterality Date  . APPENDECTOMY    . EUS N/A 10/17/2016   Procedure: UPPER ENDOSCOPIC ULTRASOUND (EUS) LINEAR;  Surgeon: Milus Banister, MD;  Location: [facility] ENDOSCOPY;  Service: Endoscopy;  Laterality: N/A;  . gist tumor excision- stomach  2015        Home Medications    Prior to Admission medications   Medication Sig Start Date End Date Taking? Authorizing Provider  buPROPion (WELLBUTRIN SR) 150 MG 12 hr tablet Take 300 mg by mouth daily.   Yes [provider]  levothyroxine (SYNTHROID, LEVOTHROID) 25 MCG tablet Take 25 mcg by mouth at bedtime.   Yes [provider]  lisinopril (PRINIVIL,ZESTRIL) 40 MG tablet Take 20 mg by mouth at bedtime.   Yes [provider]  metFORMIN (GLUCOPHAGE) 500 MG tablet Take 500 mg by mouth 2 (two) times daily with a meal.   Yes [provider]  Multiple Vitamin (MULTIVITAMIN WITH MINERALS) TABS tablet Take 1 tablet by mouth daily.   Yes [provider]  Omega-3 Fatty Acids (OMEGA-3 PO) Take 2 capsules by mouth at bedtime.   Yes [provider]  omeprazole (PRILOSEC) 20 MG capsule Take 20 mg by mouth 2 (two) times daily.   Yes [provider]  PARoxetine (PAXIL) 40 MG tablet Take 60 mg by mouth daily.    Yes [provider]  primidone (MYSOLINE) 50 MG tablet Take 150 mg by mouth daily.   Yes [provider]  ranitidine (ZANTAC) 150 MG tablet Take 150 mg by mouth 2 (two) times daily.   Yes [provider]  atorvastatin (LIPITOR) 80 MG tablet Take 40 mg by mouth at bedtime.    [provider]  cephALEXin (KEFLEX) 500 MG capsule Take 1 capsule (500 mg total) by mouth 4 (four) times daily. 03/01/18  Blanchie Dessert, MD  cetirizine (ZYRTEC) 10 MG tablet Take 1 tablet (10 mg total) by mouth daily. 02/03/18   Etta Quill, NP  famotidine (PEPCID) 20 MG tablet Take 1 tablet (20 mg total) by mouth 2 (two) times daily. 02/03/18   Etta Quill, NP  hydrOXYzine (ATARAX/VISTARIL) 25 MG tablet Take 1 tablet (25 mg total) by mouth every 8 (eight) hours as needed for itching. 02/03/18   Etta Quill, NP  Hyoscyamine Sulfate SL (LEVSIN/SL) 0.125 MG SUBL Take 1-2 tablets by mouth every 4-6 hours as needed 10/04/16   Pyrtle, Lajuan Lines, MD    Family History Family History  Problem Relation Age of Onset  . Bladder Cancer Mother     Social History Social History   Tobacco Use  . Smoking status: Former Smoker    Last attempt to quit: 1997      Years since quitting: 22.7  . Smokeless tobacco: Never Used  Substance Use Topics  . Alcohol use: Yes    Comment: rare  . Drug use: Not on file     Allergies   Patient has no known allergies.   Review of Systems Review of Systems  All other systems reviewed and are negative.    Physical Exam Updated Vital Signs BP 126/78 (BP Location: Left Arm)   Pulse (!) 107   Temp 98.5 F (36.9 C) (Oral)   Resp 20   Ht 5' 8.5" (1.74 m)   Wt 90.7 kg   SpO2 97%   BMI 29.97 kg/m   Physical Exam  Constitutional: He is oriented to person, place, and time. He appears well-developed and well-nourished. No distress.  HENT:  Head: Normocephalic and atraumatic.  Eyes: Pupils are equal, round, and reactive to light.  Cardiovascular: Intact distal pulses. Tachycardia present.  Pulmonary/Chest: Effort normal.  Musculoskeletal: He exhibits tenderness.       Legs: Neurological: He is alert and oriented to person, place, and time.  Skin: Skin is warm. Capillary refill takes less than 2 seconds.  Nursing note and vitals reviewed.    ED Treatments / Results  Labs (all labs ordered are listed, but only abnormal results are displayed) Labs Reviewed  CBG MONITORING, ED - Abnormal; Notable for the following components:      Result Value   Glucose-Capillary 218 (*)    All other components within normal limits    EKG None  Radiology No results found.  Procedures Procedures (including critical care time)  Medications Ordered in ED Medications  Tdap (BOOSTRIX) injection 0.5 mL (has no administration in time range)  cephALEXin (KEFLEX) capsule 500 mg (has no administration in time range)     Initial Impression / Assessment and Plan / ED Course  I have reviewed the triage vital signs and the nursing notes.  Pertinent labs & imaging results that were available during my care of the patient were reviewed by me and considered in my medical decision making (see chart for  details).    Elderly male presenting today after sustaining a wound to his right lower extremity 4 days ago and now having worsening pain and swelling.  Concern today for developing infection from recent wound.  Tetanus shot was updated.  Patient was placed on Keflex.  He denies any systemic symptoms at this time.  He was given strict return precautions and close follow-up with PCP.  Patient may need wound care in the future.  No open or draining lesions at this time.  Patient has been able to ambulate  and low suspicion for fracture.  Final Clinical Impressions(s) / ED Diagnoses   Final diagnoses:  Cellulitis of right lower extremity  Wound cellulitis    ED Discharge Orders         Ordered    cephALEXin (KEFLEX) 500 MG capsule  4 times daily     03/01/18 1332           Blanchie Dessert, MD 03/01/18 1337

## 2018-03-01 NOTE — ED Notes (Signed)
ED Provider at bedside. 

## 2018-03-01 NOTE — Discharge Instructions (Addendum)
Make sure you are cleaning the area daily with soap and water.  Change the bandage morning and night and use neosporin.  Place the ace wrap on during the day to help with circulation.

## 2018-03-01 NOTE — ED Notes (Signed)
Pt wound covered with bacitracin, tefla, kerlex and acewrap. Pt voices understanding of wound care and d/c instructions. No concerns at this time.

## 2018-03-01 NOTE — ED Triage Notes (Signed)
Patient had a piece of shelving fall onto his right lower leg - he has been cleaning it at home. Patient states that he started to have pain to it last night

## 2019-02-28 ENCOUNTER — Emergency Department (HOSPITAL_BASED_OUTPATIENT_CLINIC_OR_DEPARTMENT_OTHER)
Admission: EM | Admit: 2019-02-28 | Discharge: 2019-02-28 | Disposition: A | Discharge status: Home / Self Care | Payer: No Typology Code available for payment source | Attending: Emergency Medicine | Admitting: Emergency Medicine

## 2019-02-28 ENCOUNTER — Encounter (HOSPITAL_BASED_OUTPATIENT_CLINIC_OR_DEPARTMENT_OTHER): Payer: Self-pay | Admitting: *Deleted

## 2019-02-28 ENCOUNTER — Other Ambulatory Visit: Payer: Self-pay

## 2019-02-28 DIAGNOSIS — R8271 Bacteriuria: Secondary | ICD-10-CM

## 2019-02-28 DIAGNOSIS — R197 Diarrhea, unspecified: Secondary | ICD-10-CM | POA: Diagnosis present

## 2019-02-28 LAB — CBC WITH DIFFERENTIAL/PLATELET
Abs Immature Granulocytes: 0.02 10*3/uL (ref 0.00–0.07)
Basophils Absolute: 0 10*3/uL (ref 0.0–0.1)
Basophils Relative: 0 %
Eosinophils Absolute: 0.3 10*3/uL (ref 0.0–0.5)
Eosinophils Relative: 4 %
HCT: 40.1 % (ref 39.0–52.0)
Hemoglobin: 13.4 g/dL (ref 13.0–17.0)
Immature Granulocytes: 0 %
Lymphocytes Relative: 18 %
Lymphs Abs: 1.3 10*3/uL (ref 0.7–4.0)
MCH: 31.8 pg (ref 26.0–34.0)
MCHC: 33.4 g/dL (ref 30.0–36.0)
MCV: 95.2 fL (ref 80.0–100.0)
Monocytes Absolute: 0.4 10*3/uL (ref 0.1–1.0)
Monocytes Relative: 6 %
Neutro Abs: 5.3 10*3/uL (ref 1.7–7.7)
Neutrophils Relative %: 72 %
Platelets: 249 10*3/uL (ref 150–400)
RBC: 4.21 MIL/uL — ABNORMAL LOW (ref 4.22–5.81)
RDW: 12.7 % (ref 11.5–15.5)
WBC: 7.3 10*3/uL (ref 4.0–10.5)
nRBC: 0 % (ref 0.0–0.2)

## 2019-02-28 LAB — COMPREHENSIVE METABOLIC PANEL
ALT: 36 U/L (ref 0–44)
AST: 30 U/L (ref 15–41)
Albumin: 3.9 g/dL (ref 3.5–5.0)
Alkaline Phosphatase: 97 U/L (ref 38–126)
Anion gap: 13 (ref 5–15)
BUN: 15 mg/dL (ref 8–23)
CO2: 22 mmol/L (ref 22–32)
Calcium: 9.1 mg/dL (ref 8.9–10.3)
Chloride: 99 mmol/L (ref 98–111)
Creatinine, Ser: 1.23 mg/dL (ref 0.61–1.24)
GFR calc Af Amer: >60 mL/min (ref >60)
GFR calc non Af Amer: 58 mL/min — ABNORMAL LOW (ref >60)
Glucose, Bld: 277 mg/dL — ABNORMAL HIGH (ref 70–99)
Potassium: 4 mmol/L (ref 3.5–5.1)
Sodium: 134 mmol/L — ABNORMAL LOW (ref 135–145)
Total Bilirubin: 0.5 mg/dL (ref 0.3–1.2)
Total Protein: 7.9 g/dL (ref 6.5–8.1)

## 2019-02-28 LAB — URINALYSIS, ROUTINE W REFLEX MICROSCOPIC
Bilirubin Urine: NEGATIVE
Glucose, UA: NEGATIVE mg/dL
Hgb urine dipstick: NEGATIVE
Ketones, ur: NEGATIVE mg/dL
Nitrite: NEGATIVE
Protein, ur: NEGATIVE mg/dL
Specific Gravity, Urine: >1.03 — ABNORMAL HIGH (ref 1.005–1.030)
pH: 5.5 (ref 5.0–8.0)

## 2019-02-28 LAB — URINALYSIS, MICROSCOPIC (REFLEX)

## 2019-02-28 LAB — LIPASE, BLOOD: Lipase: 31 U/L (ref 11–51)

## 2019-02-28 MED ORDER — SODIUM CHLORIDE 0.9 % IV BOLUS
1000.0000 mL | Freq: Once | INTRAVENOUS | Status: AC
  Administered 2019-02-28: 1000 mL via INTRAVENOUS

## 2019-02-28 MED ORDER — SODIUM CHLORIDE 0.9 % IV BOLUS
500.0000 mL | Freq: Once | INTRAVENOUS | Status: AC
  Administered 2019-02-28: 500 mL via INTRAVENOUS

## 2019-02-28 MED ORDER — CEPHALEXIN 250 MG PO CAPS
500.0000 mg | ORAL_CAPSULE | Freq: Once | ORAL | Status: AC
  Administered 2019-02-28: 500 mg via ORAL
  Filled 2019-02-28: qty 2

## 2019-02-28 MED ORDER — CEPHALEXIN 500 MG PO CAPS: 500.0000 mg | ORAL_CAPSULE | Freq: Three times a day (TID) | ORAL | 0 refills | Status: DC

## 2019-02-28 NOTE — ED Provider Notes (Signed)
[facility] Provider Note   CSN: [id number] Arrival date & time: 02/28/19  1343     History   Chief Complaint Chief Complaint  Patient presents with   Diarrhea    HPI TRISTAIN GOTTSHALL is a 73 y.o. male.     The history is provided by the patient.  Diarrhea Quality:  Watery Severity:  Mild Onset quality:  Gradual Timing:  Intermittent Progression:  Unchanged Relieved by: took bepto bismol today and seems to have decreased stool out put. 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[place])    GERD (gastroesophageal reflux disease)    Hiatal hernia    Hyperlipidemia    Hypothyroid    Prostatitis    PTSD (post-traumatic stress disorder)    Pulmonary nodule    Sleep apnea     Patient Active Problem List   Diagnosis Date Noted   Abdominal pain    History of gastrointestinal stromal tumor (GIST)    Gastritis and gastroduodenitis     Past Surgical History:  Procedure Laterality Date   APPENDECTOMY     EUS N/A 10/17/2016   Procedure: UPPER ENDOSCOPIC ULTRASOUND (EUS) LINEAR;  Surgeon: Milus Banister, MD;  Location: [facility];  Service: Endoscopy;  Laterality: N/A;   gist tumor excision- stomach  2015        Home Medications    Prior to Admission medications   Medication Sig Start Date End Date Taking? Authorizing Provider  atorvastatin (LIPITOR) 80 MG tablet Take 40 mg by mouth at bedtime.    [provider]  buPROPion (WELLBUTRIN SR) 150 MG 12 hr tablet Take 300  mg by mouth daily.    [provider]  cephALEXin (KEFLEX) 500 MG capsule Take 1 capsule (500 mg total) by mouth 4 (four) times daily. 03/01/18   Blanchie Dessert, MD  cetirizine (ZYRTEC) 10 MG tablet Take 1 tablet (10 mg total) by mouth daily. 02/03/18   Etta Quill, NP  famotidine (PEPCID) 20 MG tablet Take 1 tablet (20 mg total) by mouth 2 (two) times daily. 02/03/18   Etta Quill, NP  hydrOXYzine (ATARAX/VISTARIL) 25 MG tablet Take 1 tablet (25 mg total) by mouth every 8 (eight) hours as needed for itching. 02/03/18   Etta Quill, NP  Hyoscyamine Sulfate SL (LEVSIN/SL) 0.125 MG SUBL Take 1-2 tablets by mouth every 4-6 hours as needed 10/04/16   Pyrtle, Lajuan Lines, MD  levothyroxine (SYNTHROID, LEVOTHROID) 25 MCG tablet Take 25 mcg by mouth at bedtime.    [provider]  lisinopril (PRINIVIL,ZESTRIL) 40 MG tablet Take 20 mg by mouth at bedtime.    [provider]  metFORMIN (GLUCOPHAGE) 500 MG tablet Take 500 mg by mouth 2 (two) times daily with a meal.    [provider]  Multiple Vitamin (MULTIVITAMIN WITH MINERALS) TABS tablet Take 1 tablet by mouth daily.    [provider]  Omega-3 Fatty Acids (OMEGA-3 PO) Take 2 capsules by mouth at bedtime.  [provider]  omeprazole (PRILOSEC) 20 MG capsule Take 20 mg by mouth 2 (two) times daily.    [provider]  PARoxetine (PAXIL) 40 MG tablet Take 60 mg by mouth daily.     [provider]  primidone (MYSOLINE) 50 MG tablet Take 150 mg by mouth daily.    [provider]  ranitidine (ZANTAC) 150 MG tablet Take 150 mg by mouth 2 (two) times daily.    [provider]    Family History Family History  Problem Relation Age of Onset   Bladder Cancer Mother     Social History Social History   Tobacco Use   Smoking status: Former Smoker    Quit date: 1997    Years since quitting: 23.7   Smokeless tobacco: Never Used  Substance Use Topics   Alcohol use:  Yes    Comment: rare   Drug use: Not Currently     Allergies   Patient has no known allergies.   Review of Systems Review of Systems  Constitutional: Negative for chills, diaphoresis and fever.  HENT: Negative for ear pain and sore throat.   Eyes: Negative for pain and visual disturbance.  Respiratory: Negative for cough and shortness of breath.   Cardiovascular: Negative for chest pain and palpitations.  Gastrointestinal: Positive for diarrhea and nausea. Negative for abdominal distention, abdominal pain, blood in stool, constipation, rectal pain and vomiting.  Genitourinary: Negative for dysuria and hematuria.  Musculoskeletal: Negative for arthralgias, back pain and myalgias.  Skin: Negative for color change and rash.  Neurological: Negative for seizures, syncope and headaches.  All other systems reviewed and are negative.    Physical Exam Updated Vital Signs  ED Triage Vitals  Enc Vitals Group     BP 02/28/19 1400 127/83     Pulse Rate 02/28/19 1400 (!) 120     Resp 02/28/19 1400 18     Temp 02/28/19 1400 98.5 F (36.9 C)     Temp Source 02/28/19 1400 Oral     SpO2 02/28/19 1400 96 %     Weight 02/28/19 1401 199 lb (90.3 kg)     Height 02/28/19 1401 5\' 8"  (1.727 m)     Head Circumference --      Peak Flow --      Pain Score 02/28/19 1401 0     Pain Loc --      Pain Edu? --      Excl. in [place]? --     Physical Exam Vitals signs and nursing note reviewed.  Constitutional:      General: He is not in acute distress.    Appearance: He is well-developed. He is not ill-appearing.  HENT:     Head: Normocephalic and atraumatic.     Nose: Nose normal.     Mouth/Throat:     Mouth: Mucous membranes are moist.  Eyes:     Extraocular Movements: Extraocular movements intact.     Conjunctiva/sclera: Conjunctivae normal.     Pupils: Pupils are equal, round, and reactive to light.  Neck:     Musculoskeletal: Normal range of motion and neck supple.  Cardiovascular:      Rate and Rhythm: Regular rhythm. Tachycardia present.     Heart sounds: No murmur.  Pulmonary:     Effort: Pulmonary effort is normal. No respiratory distress.     Breath sounds: Normal breath sounds.  Abdominal:     General: Abdomen is flat. There is no distension.  Palpations: Abdomen is soft. There is no mass.     Tenderness: There is no abdominal tenderness. There is no guarding or rebound.     Hernia: No hernia is present.  Skin:    General: Skin is warm and dry.     Capillary Refill: Capillary refill takes less than 2 seconds.  Neurological:     General: No focal deficit present.     Mental Status: He is alert.  Psychiatric:        Mood and Affect: Mood normal.      ED Treatments / Results  Labs (all labs ordered are listed, but only abnormal results are displayed) Labs Reviewed  CBC WITH DIFFERENTIAL/PLATELET - Abnormal; Notable for the following components:      Result Value   RBC 4.21 (*)    All other components within normal limits  COMPREHENSIVE METABOLIC PANEL - Abnormal; Notable for the following components:   Sodium 134 (*)    Glucose, Bld 277 (*)    GFR calc non Af Amer 58 (*)    All other components within normal limits  C DIFFICILE QUICK SCREEN W PCR REFLEX  LIPASE, BLOOD  URINALYSIS, ROUTINE W REFLEX MICROSCOPIC  GI PATHOGEN PANEL BY PCR, STOOL    EKG EKG Interpretation  Date/Time:  Sunday February 28 2019 14:16:16 EDT Ventricular Rate:  108 PR Interval:    QRS Duration: 90 QT Interval:  334 QTC Calculation: 448 R Axis:   -177 Text Interpretation:  Sinus tachycardia Confirmed by Lennice Sites [phone]) on 02/28/2019 2:29:30 PM   Radiology No results found.  Procedures Procedures (including critical care time)  Medications Ordered in ED Medications  sodium chloride 0.9 % bolus 500 mL (has no administration in time range)  sodium chloride 0.9 % bolus 1,000 mL (1,000 mLs Intravenous New Bag/Given 02/28/19 1418)     Initial  Impression / Assessment and Plan / ED Course  I have reviewed the triage vital signs and the nursing notes.  Pertinent labs & imaging results that were available during my care of the patient were reviewed by me and considered in my medical decision making (see chart for details).     GURLEY SCHUYLER is a 73 year old male with history of diverticulosis, high cholesterol, diabetes, GI stromal tumor, reflux who presents to the ED with diarrhea.  Patient with normal vitals except for mild tachycardia.  Has history of mild tachycardia.  No fever.  Patient with watery stools for the last 3 days.  Took Pepto-Bismol today and things seem to be improving.  Has not had any severe nausea or vomiting.  Does not have any abdominal pain.  No recent antibiotic use or suspicious food intake or sick contacts.  Patient overall appears well.  No tenderness to abdominal wall.  Does not appear to be clinically dehydrated but is mildly tachycardic likely from dehydration.  Will give IV fluids check basic labs and try to collect stool sample for analysis for C. difficile and other pathogens.  Likely viral process versus foodborne.  Lab work showed no significant leukocytosis, anemia, electrolyte abnormality.  Blood sugar mildly elevated however bicarb is normal.  Patient feels improved following IV fluids.  Tachycardia has improved/resolved.  Overall suspect that this is likely foodborne or viral in nature.  Recommend close follow-up with primary care doctor to provide stool sample if symptoms continue.  Patient was signed out to oncoming ED staff with patient pending finishing IV fluids and d/c.  This chart was dictated using voice  recognition software.  Despite best efforts to proofread,  errors can occur which can change the documentation meaning.    Final Clinical Impressions(s) / ED Diagnoses   Final diagnoses:  Diarrhea, unspecified type    ED Discharge Orders    None       Lennice Sites, DO 02/28/19 1503

## 2019-02-28 NOTE — Discharge Instructions (Addendum)
Continue hydration at home.    Your lab tests show a possible urine infection - take keflex (antibiotic) as prescribed.   Please return to the ED if your symptoms worsen.  Encourage hydration, bananas, rice, bland diet.

## 2019-02-28 NOTE — ED Notes (Signed)
ED Provider at bedside. 

## 2019-02-28 NOTE — ED Provider Notes (Signed)
Signed out that d/c done, receiving ivf for diarrhea, and to d/c to home after ua back.  ua w possible uti w 6-10 wbc and many bact. Will tx.   Recheck pt, feels improved, no nv, no recurrent diarrhea. Denies abd pain. Pt states feels ready for d/c to home.      Lajean Saver, MD 02/28/19 [phone]

## 2019-02-28 NOTE — ED Notes (Signed)
Pt aware of need for urine and stool sample if able.  Provided urinal at bedside.

## 2019-02-28 NOTE — ED Triage Notes (Signed)
Pt reports having diarrhea Friday and Saturday night but has not had any episodes today. Took peptobismal with some relief.. States he feels weak. Endorses nausea

## 2020-03-19 ENCOUNTER — Other Ambulatory Visit: Payer: Self-pay

## 2020-03-19 ENCOUNTER — Encounter (HOSPITAL_BASED_OUTPATIENT_CLINIC_OR_DEPARTMENT_OTHER): Payer: Self-pay | Admitting: Emergency Medicine

## 2020-03-19 ENCOUNTER — Emergency Department (HOSPITAL_BASED_OUTPATIENT_CLINIC_OR_DEPARTMENT_OTHER)
Admission: EM | Admit: 2020-03-19 | Discharge: 2020-03-19 | Disposition: A | Discharge status: Home / Self Care | Payer: No Typology Code available for payment source | Attending: Emergency Medicine | Admitting: Emergency Medicine

## 2020-03-19 ENCOUNTER — Emergency Department (HOSPITAL_BASED_OUTPATIENT_CLINIC_OR_DEPARTMENT_OTHER): Payer: No Typology Code available for payment source

## 2020-03-19 DIAGNOSIS — K219 Gastro-esophageal reflux disease without esophagitis: Secondary | ICD-10-CM | POA: Insufficient documentation

## 2020-03-19 DIAGNOSIS — Z79890 Hormone replacement therapy (postmenopausal): Secondary | ICD-10-CM | POA: Diagnosis not present

## 2020-03-19 DIAGNOSIS — Z20822 Contact with and (suspected) exposure to covid-19: Secondary | ICD-10-CM | POA: Insufficient documentation

## 2020-03-19 DIAGNOSIS — Z87891 Personal history of nicotine dependence: Secondary | ICD-10-CM | POA: Diagnosis not present

## 2020-03-19 DIAGNOSIS — E039 Hypothyroidism, unspecified: Secondary | ICD-10-CM | POA: Insufficient documentation

## 2020-03-19 DIAGNOSIS — R109 Unspecified abdominal pain: Secondary | ICD-10-CM | POA: Diagnosis not present

## 2020-03-19 DIAGNOSIS — E119 Type 2 diabetes mellitus without complications: Secondary | ICD-10-CM | POA: Insufficient documentation

## 2020-03-19 DIAGNOSIS — R197 Diarrhea, unspecified: Secondary | ICD-10-CM | POA: Insufficient documentation

## 2020-03-19 DIAGNOSIS — Z7984 Long term (current) use of oral hypoglycemic drugs: Secondary | ICD-10-CM | POA: Insufficient documentation

## 2020-03-19 DIAGNOSIS — D72829 Elevated white blood cell count, unspecified: Secondary | ICD-10-CM | POA: Insufficient documentation

## 2020-03-19 LAB — URINALYSIS, ROUTINE W REFLEX MICROSCOPIC
Bilirubin Urine: NEGATIVE
Glucose, UA: NEGATIVE mg/dL
Hgb urine dipstick: NEGATIVE
Ketones, ur: NEGATIVE mg/dL
Nitrite: NEGATIVE
Protein, ur: NEGATIVE mg/dL
Specific Gravity, Urine: 1.02 (ref 1.005–1.030)
pH: 6 (ref 5.0–8.0)

## 2020-03-19 LAB — COMPREHENSIVE METABOLIC PANEL
ALT: 38 U/L (ref 0–44)
AST: 26 U/L (ref 15–41)
Albumin: 3.9 g/dL (ref 3.5–5.0)
Alkaline Phosphatase: 103 U/L (ref 38–126)
Anion gap: 10 (ref 5–15)
BUN: 17 mg/dL (ref 8–23)
CO2: 27 mmol/L (ref 22–32)
Calcium: 9.4 mg/dL (ref 8.9–10.3)
Chloride: 102 mmol/L (ref 98–111)
Creatinine, Ser: 1.15 mg/dL (ref 0.61–1.24)
GFR, Estimated: >60 mL/min (ref >60)
Glucose, Bld: 131 mg/dL — ABNORMAL HIGH (ref 70–99)
Potassium: 5.2 mmol/L — ABNORMAL HIGH (ref 3.5–5.1)
Sodium: 139 mmol/L (ref 135–145)
Total Bilirubin: 0.6 mg/dL (ref 0.3–1.2)
Total Protein: 8 g/dL (ref 6.5–8.1)

## 2020-03-19 LAB — CBC
HCT: 42.3 % (ref 39.0–52.0)
Hemoglobin: 14.5 g/dL (ref 13.0–17.0)
MCH: 32.7 pg (ref 26.0–34.0)
MCHC: 34.3 g/dL (ref 30.0–36.0)
MCV: 95.3 fL (ref 80.0–100.0)
Platelets: 250 10*3/uL (ref 150–400)
RBC: 4.44 MIL/uL (ref 4.22–5.81)
RDW: 12.1 % (ref 11.5–15.5)
WBC: 16.6 10*3/uL — ABNORMAL HIGH (ref 4.0–10.5)
nRBC: 0 % (ref 0.0–0.2)

## 2020-03-19 LAB — URINALYSIS, MICROSCOPIC (REFLEX): RBC / HPF: NONE SEEN RBC/hpf (ref 0–5)

## 2020-03-19 LAB — RESPIRATORY PANEL BY RT PCR (FLU A&B, COVID)
Influenza A by PCR: NEGATIVE
Influenza B by PCR: NEGATIVE
SARS Coronavirus 2 by RT PCR: NEGATIVE

## 2020-03-19 LAB — LIPASE, BLOOD: Lipase: 29 U/L (ref 11–51)

## 2020-03-19 MED ORDER — SODIUM CHLORIDE 0.9 % IV BOLUS
1000.0000 mL | Freq: Once | INTRAVENOUS | Status: AC
  Administered 2020-03-19: 1000 mL via INTRAVENOUS

## 2020-03-19 MED ORDER — IOHEXOL 300 MG/ML  SOLN
100.0000 mL | Freq: Once | INTRAMUSCULAR | Status: AC | PRN
  Administered 2020-03-19: 100 mL via INTRAVENOUS

## 2020-03-19 NOTE — ED Triage Notes (Signed)
Pt c/o diarrhea onset 5 am today. Pt had diarrhea 2 weeks ago from Tuesday until Thursday.

## 2020-03-19 NOTE — ED Provider Notes (Signed)
[facility] EMERGENCY DEPARTMENT Provider Note   CSN: [id number] Arrival date & time: 03/19/20  1354     History Chief Complaint  Patient presents with  . Diarrhea    Ricardo Norton is a 74 y.o. male.  Presents to ER with concern for diarrhea.  He reports that around 2 weeks ago he had a few days of loose stools but this stopped.  Then this morning he started having lots of diarrhea again.  Estimate 8-10 episodes, watery, no blood, not dark, appears brown.  No associated abdominal pain, no nausea or vomiting.  No fevers.   HPI     Past Medical History:  Diagnosis Date  . Arthritis   . Cataract   . Depression   . Diabetes ([place])   . Diverticulosis   . Exposure to [employer]   . 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 GERD (gastroesophageal reflux disease)   . Hiatal hernia   . Hyperlipidemia   . Hypothyroid   . Prostatitis   . PTSD (post-traumatic stress disorder)   . Pulmonary nodule   . Sleep apnea     Patient Active Problem List   Diagnosis Date Noted  . Abdominal pain   . History of gastrointestinal stromal tumor (GIST)   . Gastritis and gastroduodenitis     Past Surgical History:  Procedure Laterality Date  . APPENDECTOMY    . EUS N/A 10/17/2016   Procedure: UPPER ENDOSCOPIC ULTRASOUND (EUS) LINEAR;  Surgeon: Milus Banister, MD;  Location: [facility];  Service: Endoscopy;  Laterality: N/A;  . gist tumor excision- stomach  2015       Family History  Problem Relation Age of Onset  . Bladder Cancer Mother     Social History   Tobacco Use  . Smoking status: Former Smoker    Quit date: 1997    Years since quitting: 24.7  . Smokeless tobacco: Never Used  Vaping Use  . Vaping Use: Never used  Substance Use Topics  . Alcohol use: Yes    Comment: rare  . Drug use: Not Currently    Home Medications Prior to Admission medications   Medication Sig Start Date End Date Taking? Authorizing Provider  atorvastatin (LIPITOR) 80 MG  tablet Take 40 mg by mouth at bedtime.    [provider]  buPROPion (WELLBUTRIN SR) 150 MG 12 hr tablet Take 300 mg by mouth daily.    [provider]  cephALEXin (KEFLEX) 500 MG capsule Take 1 capsule (500 mg total) by mouth 4 (four) times daily. 03/01/18   Blanchie Dessert, MD  cephALEXin (KEFLEX) 500 MG capsule Take 1 capsule (500 mg total) by mouth 3 (three) times daily. 02/28/19   Lajean Saver, MD  cetirizine (ZYRTEC) 10 MG tablet Take 1 tablet (10 mg total) by mouth daily. 02/03/18   Etta Quill, NP  famotidine (PEPCID) 20 MG tablet Take 1 tablet (20 mg total) by mouth 2 (two) times daily. 02/03/18   Etta Quill, NP  hydrOXYzine (ATARAX/VISTARIL) 25 MG tablet Take 1 tablet (25 mg total) by mouth every 8 (eight) hours as needed for itching. 02/03/18   Etta Quill, NP  Hyoscyamine Sulfate SL (LEVSIN/SL) 0.125 MG SUBL Take 1-2 tablets by mouth every 4-6 hours as needed 10/04/16   Pyrtle, Lajuan Lines, MD  levothyroxine (SYNTHROID, LEVOTHROID) 25 MCG tablet Take 25 mcg by mouth at bedtime.    [provider]  lisinopril (PRINIVIL,ZESTRIL) 40 MG tablet Take 20 mg by mouth at  bedtime.    [provider]  metFORMIN (GLUCOPHAGE) 500 MG tablet Take 500 mg by mouth 2 (two) times daily with a meal.    [provider]  Multiple Vitamin (MULTIVITAMIN WITH MINERALS) TABS tablet Take 1 tablet by mouth daily.    [provider]  Omega-3 Fatty Acids (OMEGA-3 PO) Take 2 capsules by mouth at bedtime.    [provider]  omeprazole (PRILOSEC) 20 MG capsule Take 20 mg by mouth 2 (two) times daily.    [provider]  PARoxetine (PAXIL) 40 MG tablet Take 60 mg by mouth daily.     [provider]  primidone (MYSOLINE) 50 MG tablet Take 150 mg by mouth daily.    [provider]  ranitidine (ZANTAC) 150 MG tablet Take 150 mg by mouth 2 (two) times daily.    [provider]    Allergies    Prazosin  Review of Systems     Review of Systems  Constitutional: Negative for chills and fever.  HENT: Negative for ear pain and sore throat.   Eyes: Negative for pain and visual disturbance.  Respiratory: Negative for cough and shortness of breath.   Cardiovascular: Negative for chest pain and palpitations.  Gastrointestinal: Positive for diarrhea. Negative for abdominal pain and vomiting.  Genitourinary: Negative for dysuria and hematuria.  Musculoskeletal: Negative for arthralgias and back pain.  Skin: Negative for color change and rash.  Neurological: Negative for seizures and syncope.  All other systems reviewed and are negative.   Physical Exam Updated Vital Signs BP 131/78 (BP Location: Right Arm)   Pulse 83   Temp 98.3 F (36.8 C) (Oral)   Resp 16   Ht 5\' 8"  (1.727 m)   Wt 85.7 kg   SpO2 97%   BMI 28.74 kg/m   Physical Exam Vitals and nursing note reviewed.  Constitutional:      Appearance: He is well-developed.  HENT:     Head: Normocephalic and atraumatic.  Eyes:     Conjunctiva/sclera: Conjunctivae normal.  Cardiovascular:     Rate and Rhythm: Normal rate and regular rhythm.     Heart sounds: No murmur heard.   Pulmonary:     Effort: Pulmonary effort is normal. No respiratory distress.     Breath sounds: Normal breath sounds.  Abdominal:     Palpations: Abdomen is soft.     Tenderness: There is no abdominal tenderness.  Musculoskeletal:        General: No swelling or tenderness.     Cervical back: Neck supple.  Skin:    General: Skin is warm and dry.  Neurological:     General: No focal deficit present.     Mental Status: He is alert.     ED Results / Procedures / Treatments   Labs (all labs ordered are listed, but only abnormal results are displayed) Labs Reviewed  COMPREHENSIVE METABOLIC PANEL - Abnormal; Notable for the following components:      Result Value   Potassium 5.2 (*)    Glucose, Bld 131 (*)    All other components within normal limits  CBC - Abnormal;  Notable for the following components:   WBC 16.6 (*)    All other components within normal limits  URINALYSIS, ROUTINE W REFLEX MICROSCOPIC - Abnormal; Notable for the following components:   Leukocytes,Ua TRACE (*)    All other components within normal limits  URINALYSIS, MICROSCOPIC (REFLEX) - Abnormal; Notable for the following components:   Bacteria, UA  FEW (*)    All other components within normal limits  RESPIRATORY PANEL BY RT PCR (FLU A&B, COVID)  GASTROINTESTINAL PANEL BY PCR, STOOL (REPLACES STOOL CULTURE)  C DIFFICILE QUICK SCREEN W PCR REFLEX  LIPASE, BLOOD    EKG None  Radiology CT ABDOMEN PELVIS W CONTRAST  Result Date: 03/19/2020 CLINICAL DATA:  74 year old male with acute abdominal pain. EXAM: CT ABDOMEN AND PELVIS WITH CONTRAST TECHNIQUE: Multidetector CT imaging of the abdomen and pelvis was performed using the standard protocol following bolus administration of intravenous contrast. CONTRAST:  113mL OMNIPAQUE IOHEXOL 300 MG/ML  SOLN COMPARISON:  CT abdomen pelvis dated 10/21/2018. FINDINGS: Lower chest: There is an 8 mm nodule at the left lung base seen on the prior CT and reported on the chest CT of 02/02/2018. The visualized lung bases are otherwise clear. No intra-abdominal free air or free fluid. Hepatobiliary: Subcentimeter hypodense focus in the dome of the liver so small to characterize. The liver is otherwise unremarkable. No intrahepatic biliary ductal dilatation. No calcified gallstone or pericholecystic fluid. Pancreas: Unremarkable. No pancreatic ductal dilatation or surrounding inflammatory changes. Spleen: Normal in size without focal abnormality. Adrenals/Urinary Tract: The adrenal glands unremarkable. Multiple bilateral renal cysts measuring up to 5 cm in the inferior pole of the left kidney. Several subcentimeter hypodensities are too small to characterize. There is no hydronephrosis on either side. There is symmetric enhancement and excretion of contrast  by both kidneys. The visualized ureters and urinary bladder appear unremarkable. Stomach/Bowel: There is severe sigmoid diverticulosis with muscular hypertrophy. No acute inflammatory changes. There is no bowel obstruction or active inflammation. Appendectomy. Vascular/Lymphatic: Mild aortoiliac atherosclerotic disease. The IVC is unremarkable. No portal venous gas. There is no adenopathy. Reproductive: The prostate and seminal vesicles are grossly unremarkable. No pelvic mass. Other: None Musculoskeletal: No acute or significant osseous findings. IMPRESSION: 1. No acute intra-abdominal or pelvic pathology. 2. Severe sigmoid diverticulosis. No bowel obstruction. 3. Aortic Atherosclerosis (ICD10-I70.0). Electronically Signed   By: Anner Crete M.D.   On: 03/19/2020 18:47    Procedures Procedures (including critical care time)  Medications Ordered in ED Medications  sodium chloride 0.9 % bolus 1,000 mL ( Intravenous Stopped 03/19/20 1820)  iohexol (OMNIPAQUE) 300 MG/ML solution 100 mL (100 mLs Intravenous Contrast Given 03/19/20 1737)    ED Course  I have reviewed the triage vital signs and the nursing notes.  Pertinent labs & imaging results that were available during my care of the patient were reviewed by me and considered in my medical decision making (see chart for details).    MDM Rules/Calculators/A&P                          74 year old male presents to ER with concern for diarrhea.  On exam, he is well-appearing with stable vital signs, afebrile, soft abdomen.  Lab work notable for leukocytosis but otherwise stable labs.  Creatinine at baseline.  CT abdomen pelvis obtained given leukocytosis and diarrhea.  No acute intra-abdominal pathology identified.  Diverticulosis but no diverticulitis.  Originally had ordered stool sample to further identify the cause, however patient unable to provide stool sample.  He is well-appearing on reassessment, believe appropriate for discharge and  outpatient management.  Recommended he follow-up with his primary doctor for recheck and repeat blood work.  He was discharged home.   After the discussed management above, the patient was determined to be safe for discharge.  The patient was in agreement with this plan  and all questions regarding their care were answered.  ED return precautions were discussed and the patient will return to the ED with any significant worsening of condition.  Final Clinical Impression(s) / ED Diagnoses Final diagnoses:  Diarrhea, unspecified type  Leukocytosis, unspecified type    Rx / DC Orders ED Discharge Orders    None       Lucrezia Starch, MD 03/19/20 2009

## 2020-03-19 NOTE — ED Notes (Signed)
Provider at the bedside.  

## 2020-03-19 NOTE — Discharge Instructions (Signed)
Please follow-up with your primary doctor regarding your symptoms from today.  Recommend having your blood work rechecked including your white blood cell count as this was elevated today.  If you develop abdominal pain, vomiting, fever or other new concerning symptom, return to ER for reassessment.

## 2020-03-19 NOTE — ED Notes (Signed)
Patient aware of order for stool specimen.  Reports he will let us know if he is able to go.

## 2020-04-04 NOTE — Progress Notes (Deleted)
Assessment/Plan:   1.  Essential Tremor.  -This is evidenced by the symmetrical nature and longstanding hx of gradually getting worse.  We discussed nature and pathophysiology.  We discussed that this can continue to gradually get worse with time.  We discussed that some medications can worsen this, as can caffeine use.  We discussed medication therapy as well as surgical therapy.  Ultimately, the patient decided to ***.     Subjective:   Ricardo Norton was seen in consultation in the movement disorder clinic at the request of Phineas Inches, PA-C.  The evaluation is for tremor.  Tremor started approximately *** ago and involves the ***.  Tremor is most noticeable when ***.   There is *** family hx of tremor.    Affected by caffeine:  {yes no:314532} Affected by alcohol:  {yes no:314532} Affected by stress:  {yes no:314532} Affected by fatigue:  {yes no:314532} Spills soup if on spoon:  {yes no:314532} Spills glass of liquid if full:  {yes no:314532} Affects ADL's (tying shoes, brushing teeth, etc):  {yes no:314532}  Current/Previously tried tremor medications: on primidone, 50 mg, 3 tablets daily  Current medications that may exacerbate tremor:  ***  Outside reports reviewed: {Outside review:15817}.  Allergies  Allergen Reactions  . Prazosin Other (See Comments)    Current Outpatient Medications  Medication Instructions  . atorvastatin (LIPITOR) 40 mg, Oral, Daily at bedtime  . buPROPion (WELLBUTRIN SR) 300 mg, Oral, Daily  . cephALEXin (KEFLEX) 500 mg, Oral, 4 times daily  . cephALEXin (KEFLEX) 500 mg, Oral, 3 times daily  . cetirizine (ZYRTEC) 10 mg, Oral, Daily  . famotidine (PEPCID) 20 mg, Oral, 2 times daily  . hydrOXYzine (ATARAX/VISTARIL) 25 mg, Oral, Every 8 hours PRN  . Hyoscyamine Sulfate SL (LEVSIN/SL) 0.125 MG SUBL Take 1-2 tablets by mouth every 4-6 hours as needed  . levothyroxine (SYNTHROID) 25 mcg, Oral, Daily at bedtime  . lisinopril (ZESTRIL) 20 mg, Oral,  Daily at bedtime  . metFORMIN (GLUCOPHAGE) 500 mg, Oral, 2 times daily with meals  . Multiple Vitamin (MULTIVITAMIN WITH MINERALS) TABS tablet 1 tablet, Oral, Daily  . Omega-3 Fatty Acids (OMEGA-3 PO) 2 capsules, Oral, Daily at bedtime  . omeprazole (PRILOSEC) 20 mg, Oral, 2 times daily  . PARoxetine (PAXIL) 60 mg, Oral, Daily  . primidone (MYSOLINE) 150 mg, Oral, Daily  . ranitidine (ZANTAC) 150 mg, Oral, 2 times daily     Objective:   VITALS:  There were no vitals filed for this visit. Gen:  Appears stated age and in NAD. HEENT:  Normocephalic, atraumatic. The mucous membranes are moist. The superficial temporal arteries are without ropiness or tenderness. Cardiovascular: Regular rate and rhythm. Lungs: Clear to auscultation bilaterally. Neck: There are no carotid bruits noted bilaterally.  NEUROLOGICAL:  Orientation:  The patient is alert and oriented x 3.   Cranial nerves: There is good facial symmetry. Extraocular muscles are intact and visual fields are full to confrontational testing. Speech is fluent and clear. Soft palate rises symmetrically and there is no tongue deviation. Hearing is intact to conversational tone. Tone: Tone is good throughout. Sensation: Sensation is intact to light touch touch throughout (facial, trunk, extremities). Vibration is intact at the bilateral big toe. There is no extinction with double simultaneous stimulation. There is no sensory dermatomal level identified. Coordination:  The patient has no dysdiadichokinesia or dysmetria. Motor: Strength is 5/5 in the bilateral upper and lower extremities.  Shoulder shrug is equal bilaterally.  There is no pronator  drift.  There are no fasciculations noted. DTR's: Deep tendon reflexes are 2/4 at the bilateral biceps, triceps, brachioradialis, patella and achilles.  Plantar responses are downgoing bilaterally. Gait and Station: The patient is able to ambulate without difficulty. The patient is able to heel toe  walk without any difficulty. The patient is able to ambulate in a tandem fashion. The patient is able to stand in the Romberg position.   MOVEMENT EXAM: Tremor:  There is *** tremor in the UE, noted most significantly with action.  The patient is *** able to draw Archimedes spirals without significant difficulty.  There is *** tremor at rest.  The patient is *** able to pour water from one glass to another without spilling it.  I have reviewed and interpreted the following labs independently   Chemistry      Component Value Date/Time   NA 139 03/19/2020 1422   K 5.2 (H) 03/19/2020 1422   CL 102 03/19/2020 1422   CO2 27 03/19/2020 1422   BUN 17 03/19/2020 1422   CREATININE 1.15 03/19/2020 1422      Component Value Date/Time   CALCIUM 9.4 03/19/2020 1422   ALKPHOS 103 03/19/2020 1422   AST 26 03/19/2020 1422   ALT 38 03/19/2020 1422   BILITOT 0.6 03/19/2020 1422      Lab Results  Component Value Date   WBC 16.6 (H) 03/19/2020   HGB 14.5 03/19/2020   HCT 42.3 03/19/2020   MCV 95.3 03/19/2020   PLT 250 03/19/2020   No results found for: TSH Patient had lab work on 03/27/2020 from primary care.  He was positive for C. difficile at that time.   Total time spent on today's visit was ***60 minutes, including both face-to-face time and nonface-to-face time.  Time included that spent on review of records (prior notes available to me/labs/imaging if pertinent), discussing treatment and goals, answering patient's questions and coordinating care.  CC:  Jefm Petty, MD

## 2020-04-04 NOTE — Progress Notes (Deleted)
Assessment/Plan:   1.  Essential Tremor.  -This is evidenced by the symmetrical nature and longstanding hx of gradually getting worse.  We discussed nature and pathophysiology.  We discussed that this can continue to gradually get worse with time.  We discussed that some medications can worsen this, as can caffeine use.  We discussed medication therapy as well as surgical therapy.  Ultimately, the patient decided to ***.     Subjective:   Ricardo Norton was seen in consultation in the movement disorder clinic at the request of Phineas Inches, PA-C ([facility]).  Outside records that were made available to me were reviewed but last visit was phone visit only so no examination noted.  The evaluation is for tremor.  Tremor started approximately *** ago and involves the bilateral UE, R>L.  Tremor is most noticeable when ***.   There is *** family hx of tremor.    Affected by caffeine:  {yes no:314532} Affected by alcohol:  {yes no:314532} Affected by stress:  {yes no:314532} Affected by fatigue:  {yes no:314532} Spills soup if on spoon:  {yes no:314532} Spills glass of liquid if full:  {yes no:314532} Affects ADL's (tying shoes, brushing teeth, etc):  {yes no:314532}  Current/Previously tried tremor medications: on primidone, 50 mg,1.5 tabs bid  Current medications that may exacerbate tremor:  ***  Outside reports reviewed: {Outside review:15817}.  Allergies  Allergen Reactions  . Prazosin Other (See Comments)    Current Outpatient Medications  Medication Instructions  . atorvastatin (LIPITOR) 40 mg, Oral, Daily at bedtime  . buPROPion (WELLBUTRIN SR) 300 mg, Oral, Daily  . cephALEXin (KEFLEX) 500 mg, Oral, 4 times daily  . cephALEXin (KEFLEX) 500 mg, Oral, 3 times daily  . cetirizine (ZYRTEC) 10 mg, Oral, Daily  . famotidine (PEPCID) 20 mg, Oral, 2 times daily  . hydrOXYzine (ATARAX/VISTARIL) 25 mg, Oral, Every 8 hours PRN  . Hyoscyamine Sulfate SL (LEVSIN/SL) 0.125 MG SUBL Take  1-2 tablets by mouth every 4-6 hours as needed  . levothyroxine (SYNTHROID) 25 mcg, Oral, Daily at bedtime  . lisinopril (ZESTRIL) 20 mg, Oral, Daily at bedtime  . metFORMIN (GLUCOPHAGE) 500 mg, Oral, 2 times daily with meals  . Multiple Vitamin (MULTIVITAMIN WITH MINERALS) TABS tablet 1 tablet, Oral, Daily  . Omega-3 Fatty Acids (OMEGA-3 PO) 2 capsules, Oral, Daily at bedtime  . omeprazole (PRILOSEC) 20 mg, Oral, 2 times daily  . PARoxetine (PAXIL) 60 mg, Oral, Daily  . primidone (MYSOLINE) 150 mg, Oral, Daily  . ranitidine (ZANTAC) 150 mg, Oral, 2 times daily     Objective:   VITALS:  There were no vitals filed for this visit. Gen:  Appears stated age and in NAD. HEENT:  Normocephalic, atraumatic. The mucous membranes are moist. The superficial temporal arteries are without ropiness or tenderness. Cardiovascular: Regular rate and rhythm. Lungs: Clear to auscultation bilaterally. Neck: There are no carotid bruits noted bilaterally.  NEUROLOGICAL:  Orientation:  The patient is alert and oriented x 3.   Cranial nerves: There is good facial symmetry. Extraocular muscles are intact and visual fields are full to confrontational testing. Speech is fluent and clear. Soft palate rises symmetrically and there is no tongue deviation. Hearing is intact to conversational tone. Tone: Tone is good throughout. Sensation: Sensation is intact to light touch touch throughout (facial, trunk, extremities). Vibration is intact at the bilateral big toe. There is no extinction with double simultaneous stimulation. There is no sensory dermatomal level identified. Coordination:  The patient has  no dysdiadichokinesia or dysmetria. Motor: Strength is 5/5 in the bilateral upper and lower extremities.  Shoulder shrug is equal bilaterally.  There is no pronator drift.  There are no fasciculations noted. DTR's: Deep tendon reflexes are 2/4 at the bilateral biceps, triceps, brachioradialis, patella and achilles.   Plantar responses are downgoing bilaterally. Gait and Station: The patient is able to ambulate without difficulty. The patient is able to heel toe walk without any difficulty. The patient is able to ambulate in a tandem fashion. The patient is able to stand in the Romberg position.   MOVEMENT EXAM: Tremor:  There is *** tremor in the UE, noted most significantly with action.  The patient is *** able to draw Archimedes spirals without significant difficulty.  There is *** tremor at rest.  The patient is *** able to pour water from one glass to another without spilling it.  I have reviewed and interpreted the following labs independently   Chemistry      Component Value Date/Time   NA 139 03/19/2020 1422   K 5.2 (H) 03/19/2020 1422   CL 102 03/19/2020 1422   CO2 27 03/19/2020 1422   BUN 17 03/19/2020 1422   CREATININE 1.15 03/19/2020 1422      Component Value Date/Time   CALCIUM 9.4 03/19/2020 1422   ALKPHOS 103 03/19/2020 1422   AST 26 03/19/2020 1422   ALT 38 03/19/2020 1422   BILITOT 0.6 03/19/2020 1422      Lab Results  Component Value Date   WBC 16.6 (H) 03/19/2020   HGB 14.5 03/19/2020   HCT 42.3 03/19/2020   MCV 95.3 03/19/2020   PLT 250 03/19/2020   No results found for: TSH Patient had lab work on 03/27/2020 from primary care.  He was positive for C. difficile at that time.   Total time spent on today's visit was ***60 minutes, including both face-to-face time and nonface-to-face time.  Time included that spent on review of records (prior notes available to me/labs/imaging if pertinent), discussing treatment and goals, answering patient's questions and coordinating care.  CC:  Jefm Petty, MD

## 2020-04-06 ENCOUNTER — Ambulatory Visit: Payer: No Typology Code available for payment source | Admitting: Neurology

## 2020-04-10 ENCOUNTER — Other Ambulatory Visit (HOSPITAL_BASED_OUTPATIENT_CLINIC_OR_DEPARTMENT_OTHER): Payer: Self-pay | Admitting: Physician Assistant

## 2020-04-10 DIAGNOSIS — Z13820 Encounter for screening for osteoporosis: Secondary | ICD-10-CM

## 2020-04-11 ENCOUNTER — Ambulatory Visit (HOSPITAL_BASED_OUTPATIENT_CLINIC_OR_DEPARTMENT_OTHER)
Admission: RE | Admit: 2020-04-11 | Discharge: 2020-04-11 | Disposition: A | Discharge status: Home / Self Care | Payer: No Typology Code available for payment source | Source: Ambulatory Visit | Attending: Physician Assistant | Admitting: Physician Assistant

## 2020-04-11 ENCOUNTER — Other Ambulatory Visit: Payer: Self-pay

## 2020-04-11 DIAGNOSIS — Z13820 Encounter for screening for osteoporosis: Secondary | ICD-10-CM | POA: Insufficient documentation

## 2020-04-13 ENCOUNTER — Ambulatory Visit: Payer: No Typology Code available for payment source | Admitting: Neurology

## 2020-04-20 NOTE — Progress Notes (Signed)
Assessment/Plan:   1.  Essential Tremor.  -This is evidenced by the symmetrical nature and longstanding hx of gradually getting worse.  We discussed nature and pathophysiology.  We discussed that this can continue to gradually get worse with time.  We discussed that some medications can worsen this, as can caffeine use.  We discussed medication therapy as well as surgical therapy.  Discussed in detail DBS and focused ultrasound and the differences between the two.  Shown HIPAA compliant videos of patients who have had DBS.  If we ever decide to do DBS, he may only need left VIM surgery.  Ultimately, the patient decided to slowly and cautiously trial propranolol, 20 mg in the morning for 2 weeks and then, after he checks his blood pressure and knows that he feels okay, he can increase to 20 mg twice per day.  If he does not feel okay or has lightheadedness or dizziness, then he will not increase the medication.  R/B/SE were discussed.  The opportunity to ask questions was given and they were answered to the best of my ability.  The patient expressed understanding and willingness to follow the outlined treatment protocols.  -He will continue primidone, but I told him that he may get better efficacy by taking primidone, 50 mg, 2 tablets in the morning and 1 at night.  I also told him that we may need to increase his medication in the future.  2.  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  He does recall he has history of sciatica.  No history of imaging of the lumbar spine in the past.   Subjective:   Ricardo Norton was seen in consultation in the movement disorder clinic at the request of Jefm Petty, MD.  The evaluation is for tremor.  Wife present and supplements hx.   [facility] but last visit was a phone visit so no examination noted.     Tremor started in about 1969 - after he came back from [place].  It involves the  bilateral UE, R>L.  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   There is no family hx of tremor.    Affected by caffeine:  No. (1 cup coffee per day) Affected by alcohol:  Doesn't drink Alcohol Affected by stress:  No. Affected by fatigue:  No. Spills soup if on spoon:  Yes.   Spills glass of liquid if full: may or may not Affects ADL's (tying shoes, brushing teeth, etc):  No., shaves electric  Current/Previously tried tremor medications: on primidone, 50 mg, 3 po q hs (about 7pm) - started primidone 10 years ago - helped when first started but not sure now.  No SE.  No other tried meds  Current medications that may exacerbate tremor:  n/a  Outside reports reviewed: historical medical records.  Allergies  Allergen Reactions  . Prazosin Other (See Comments)    Current Outpatient Medications  Medication Instructions  . atorvastatin (LIPITOR) 40 mg, Oral, Daily at bedtime  . buPROPion (WELLBUTRIN SR) 300 mg, Oral, Daily  . levothyroxine (SYNTHROID) 25 mcg, Oral, Daily at bedtime  . lisinopril (ZESTRIL) 20 mg, Oral, Daily at bedtime  . metFORMIN (GLUCOPHAGE) 500 mg, Oral, 2 times daily with meals  . Multiple Vitamin (MULTIVITAMIN WITH MINERALS) TABS tablet 1 tablet, Oral, Daily  . Omega-3 Fatty Acids (OMEGA-3 PO) 2 capsules, Oral, Daily at bedtime  .  omeprazole (PRILOSEC) 20 mg, Oral, 2 times daily  . PARoxetine (PAXIL) 60 mg, Oral, Daily  . primidone (MYSOLINE) 150 mg, Oral, Daily  . ranitidine (ZANTAC) 150 mg, Oral, 2 times daily     Objective:   VITALS:   Vitals:   04/24/20 1005  BP: 123/82  Pulse: 87  SpO2: 94%  Weight: 194 lb (88 kg)  Height: 5' 7.5" (1.715 m)   Gen:  Appears stated age and in NAD. HEENT:  Normocephalic, atraumatic. The mucous membranes are moist. The superficial temporal arteries are without ropiness or tenderness. Cardiovascular: Regular rate and rhythm. Lungs: Clear to auscultation bilaterally. Neck: There are no  carotid bruits noted bilaterally.  NEUROLOGICAL:  Orientation:  The patient is alert and oriented x 3.   Cranial nerves: There is good facial symmetry. Extraocular muscles are intact and visual fields are full to confrontational testing. Speech is fluent and clear. Soft palate rises symmetrically and there is no tongue deviation. Hearing is intact to conversational tone. Tone: Tone is good throughout. Sensation: Sensation is intact to light touch touch throughout (facial, trunk, extremities). Vibration is intact at the bilateral big toe. There is no extinction with double simultaneous stimulation. There is no sensory dermatomal level identified. Coordination:  The patient has no dysdiadichokinesia or dysmetria. Motor: Strength is 5/5 in the bilateral upper and lower extremities.  Shoulder shrug is equal bilaterally.  There is no pronator drift.  There are no fasciculations noted. DTR's: Deep tendon reflexes are 2/4 at the bilateral biceps, triceps, brachioradialis, 3-4 at the bilateral patella and 2/4 at the bilateral achilles.  Plantar responses are downgoing bilaterally. Gait and Station: The patient is able to ambulate without difficulty. The patient is able to heel toe walk without any difficulty. The patient is able to ambulate in a tandem fashion. The patient is able to stand in the Romberg position.   MOVEMENT EXAM: Tremor:  There is postural tremor bilaterally, right greater than left.  When his arms are placed in the wing beating position, the left becomes almost as bad as the right.  Tremor is moderate in nature on the right.  He has trouble with Archimedes spirals bilaterally, but is able to do it.  He has trouble writing a sentence.  He has trouble pouring water with a full glass of water is in the right hand, but he does fairly well with the left.  I have reviewed and interpreted the following labs independently   Chemistry      Component Value Date/Time   NA 139 03/19/2020 1422   K  5.2 (H) 03/19/2020 1422   CL 102 03/19/2020 1422   CO2 27 03/19/2020 1422   BUN 17 03/19/2020 1422   CREATININE 1.15 03/19/2020 1422      Component Value Date/Time   CALCIUM 9.4 03/19/2020 1422   ALKPHOS 103 03/19/2020 1422   AST 26 03/19/2020 1422   ALT 38 03/19/2020 1422   BILITOT 0.6 03/19/2020 1422      Lab Results  Component Value Date   WBC 16.6 (H) 03/19/2020   HGB 14.5 03/19/2020   HCT 42.3 03/19/2020   MCV 95.3 03/19/2020   PLT 250 03/19/2020   No results found for: TSH    Total time spent on today's visit was 60 minutes, including both face-to-face time and nonface-to-face time.  Time included that spent on review of records (prior notes available to me/labs/imaging if pertinent), discussing treatment and goals, answering patient's questions and coordinating care.  CC:  Jefm Petty, MD

## 2020-04-24 ENCOUNTER — Ambulatory Visit (INDEPENDENT_AMBULATORY_CARE_PROVIDER_SITE_OTHER): Payer: No Typology Code available for payment source | Admitting: Neurology

## 2020-04-24 ENCOUNTER — Encounter: Payer: Self-pay | Admitting: Neurology

## 2020-04-24 ENCOUNTER — Other Ambulatory Visit: Payer: Self-pay

## 2020-04-24 VITALS — BP 123/82 | HR 87 | Ht 67.5 in | Wt 194.0 lb

## 2020-04-24 DIAGNOSIS — M5416 Radiculopathy, lumbar region: Secondary | ICD-10-CM | POA: Diagnosis not present

## 2020-04-24 DIAGNOSIS — R292 Abnormal reflex: Secondary | ICD-10-CM | POA: Diagnosis not present

## 2020-04-24 DIAGNOSIS — G250 Essential tremor: Secondary | ICD-10-CM

## 2020-04-24 MED ORDER — PROPRANOLOL HCL 20 MG PO TABS: 20.0000 mg | ORAL_TABLET | Freq: Two times a day (BID) | ORAL | 1 refills | Status: DC

## 2020-04-24 NOTE — Patient Instructions (Addendum)
1.  Start propranolol - 20 mg in the AM for 2 weeks.  Keep an eye on your blood pressure and pulse and make sure that you are not dizzy.  As long as you are not and you are feeling okay, you can increase the medication to 20 mg twice per day in 2 weeks.  Again, keep an eye on your blood pressure 2.  Continue your primidone.  I think that spreading it out some will help, perhaps taking 50mg , 2 in the AM, 1 at night. 3.    The physicians and staff at [facility] are committed to providing excellent care. You may receive a survey requesting feedback about your experience at our office. We strive to receive "very good" responses to the survey questions. If you feel that your experience would prevent you from giving the office a "very good " response, please contact our office to try to remedy the situation. We may be reached at [phone]. Thank you for taking the time out of your busy day to complete the survey.

## 2020-05-06 ENCOUNTER — Other Ambulatory Visit: Payer: Self-pay

## 2020-05-06 ENCOUNTER — Ambulatory Visit (HOSPITAL_BASED_OUTPATIENT_CLINIC_OR_DEPARTMENT_OTHER)
Admission: RE | Admit: 2020-05-06 | Discharge: 2020-05-06 | Disposition: A | Discharge status: Home / Self Care | Payer: No Typology Code available for payment source | Source: Ambulatory Visit | Attending: Neurology | Admitting: Neurology

## 2020-05-06 DIAGNOSIS — M5416 Radiculopathy, lumbar region: Secondary | ICD-10-CM | POA: Insufficient documentation

## 2020-05-06 DIAGNOSIS — R292 Abnormal reflex: Secondary | ICD-10-CM | POA: Insufficient documentation

## 2020-08-02 ENCOUNTER — Emergency Department (HOSPITAL_BASED_OUTPATIENT_CLINIC_OR_DEPARTMENT_OTHER)
Admission: EM | Admit: 2020-08-02 | Discharge: 2020-08-02 | Disposition: A | Discharge status: Home / Self Care | Payer: No Typology Code available for payment source | Attending: Emergency Medicine | Admitting: Emergency Medicine

## 2020-08-02 ENCOUNTER — Other Ambulatory Visit: Payer: Self-pay

## 2020-08-02 ENCOUNTER — Emergency Department (HOSPITAL_BASED_OUTPATIENT_CLINIC_OR_DEPARTMENT_OTHER): Payer: No Typology Code available for payment source

## 2020-08-02 ENCOUNTER — Encounter (HOSPITAL_BASED_OUTPATIENT_CLINIC_OR_DEPARTMENT_OTHER): Payer: Self-pay

## 2020-08-02 DIAGNOSIS — R000 Tachycardia, unspecified: Secondary | ICD-10-CM | POA: Diagnosis not present

## 2020-08-02 DIAGNOSIS — E039 Hypothyroidism, unspecified: Secondary | ICD-10-CM | POA: Diagnosis not present

## 2020-08-02 DIAGNOSIS — Z79899 Other long term (current) drug therapy: Secondary | ICD-10-CM | POA: Diagnosis not present

## 2020-08-02 DIAGNOSIS — Z7984 Long term (current) use of oral hypoglycemic drugs: Secondary | ICD-10-CM | POA: Insufficient documentation

## 2020-08-02 DIAGNOSIS — R112 Nausea with vomiting, unspecified: Secondary | ICD-10-CM | POA: Insufficient documentation

## 2020-08-02 DIAGNOSIS — Z87891 Personal history of nicotine dependence: Secondary | ICD-10-CM | POA: Insufficient documentation

## 2020-08-02 DIAGNOSIS — E119 Type 2 diabetes mellitus without complications: Secondary | ICD-10-CM | POA: Insufficient documentation

## 2020-08-02 DIAGNOSIS — R197 Diarrhea, unspecified: Secondary | ICD-10-CM | POA: Insufficient documentation

## 2020-08-02 DIAGNOSIS — R1110 Vomiting, unspecified: Secondary | ICD-10-CM | POA: Diagnosis present

## 2020-08-02 LAB — CBC WITH DIFFERENTIAL/PLATELET
Abs Immature Granulocytes: 0.1 10*3/uL — ABNORMAL HIGH (ref 0.00–0.07)
Basophils Absolute: 0 10*3/uL (ref 0.0–0.1)
Basophils Relative: 0 %
Eosinophils Absolute: 0 10*3/uL (ref 0.0–0.5)
Eosinophils Relative: 0 %
HCT: 47.7 % (ref 39.0–52.0)
Hemoglobin: 16.7 g/dL (ref 13.0–17.0)
Immature Granulocytes: 1 %
Lymphocytes Relative: 3 %
Lymphs Abs: 0.5 10*3/uL — ABNORMAL LOW (ref 0.7–4.0)
MCH: 32.2 pg (ref 26.0–34.0)
MCHC: 35 g/dL (ref 30.0–36.0)
MCV: 92.1 fL (ref 80.0–100.0)
Monocytes Absolute: 0.4 10*3/uL (ref 0.1–1.0)
Monocytes Relative: 2 %
Neutro Abs: 15.6 10*3/uL — ABNORMAL HIGH (ref 1.7–7.7)
Neutrophils Relative %: 94 %
Platelets: 298 10*3/uL (ref 150–400)
RBC: 5.18 MIL/uL (ref 4.22–5.81)
RDW: 12.3 % (ref 11.5–15.5)
WBC: 16.6 10*3/uL — ABNORMAL HIGH (ref 4.0–10.5)
nRBC: 0 % (ref 0.0–0.2)

## 2020-08-02 LAB — URINALYSIS, ROUTINE W REFLEX MICROSCOPIC
Glucose, UA: NEGATIVE mg/dL
Hgb urine dipstick: NEGATIVE
Ketones, ur: 40 mg/dL — AB
Nitrite: NEGATIVE
Protein, ur: 30 mg/dL — AB
Specific Gravity, Urine: 1.03 (ref 1.005–1.030)
pH: 6 (ref 5.0–8.0)

## 2020-08-02 LAB — COMPREHENSIVE METABOLIC PANEL
ALT: 35 U/L (ref 0–44)
AST: 31 U/L (ref 15–41)
Albumin: 4.5 g/dL (ref 3.5–5.0)
Alkaline Phosphatase: 115 U/L (ref 38–126)
Anion gap: 16 — ABNORMAL HIGH (ref 5–15)
BUN: 22 mg/dL (ref 8–23)
CO2: 23 mmol/L (ref 22–32)
Calcium: 9.9 mg/dL (ref 8.9–10.3)
Chloride: 98 mmol/L (ref 98–111)
Creatinine, Ser: 1.42 mg/dL — ABNORMAL HIGH (ref 0.61–1.24)
GFR, Estimated: 52 mL/min — ABNORMAL LOW (ref >60)
Glucose, Bld: 298 mg/dL — ABNORMAL HIGH (ref 70–99)
Potassium: 4.5 mmol/L (ref 3.5–5.1)
Sodium: 137 mmol/L (ref 135–145)
Total Bilirubin: 0.9 mg/dL (ref 0.3–1.2)
Total Protein: 9.2 g/dL — ABNORMAL HIGH (ref 6.5–8.1)

## 2020-08-02 LAB — URINALYSIS, MICROSCOPIC (REFLEX)

## 2020-08-02 LAB — LIPASE, BLOOD: Lipase: 31 U/L (ref 11–51)

## 2020-08-02 LAB — TROPONIN I (HIGH SENSITIVITY)
Troponin I (High Sensitivity): 4 ng/L (ref <18)
Troponin I (High Sensitivity): 6 ng/L (ref <18)

## 2020-08-02 MED ORDER — LOPERAMIDE HCL 2 MG PO CAPS
4.0000 mg | ORAL_CAPSULE | Freq: Once | ORAL | Status: AC
  Administered 2020-08-02: 4 mg via ORAL
  Filled 2020-08-02: qty 2

## 2020-08-02 MED ORDER — SODIUM CHLORIDE 0.9 % IV BOLUS
1000.0000 mL | Freq: Once | INTRAVENOUS | Status: AC
  Administered 2020-08-02: 1000 mL via INTRAVENOUS

## 2020-08-02 MED ORDER — ONDANSETRON 8 MG PO TBDP: 8.0000 mg | ORAL_TABLET | Freq: Three times a day (TID) | ORAL | 0 refills | Status: AC | PRN

## 2020-08-02 MED ORDER — DILTIAZEM HCL 25 MG/5ML IV SOLN
10.0000 mg | Freq: Once | INTRAVENOUS | Status: AC
  Administered 2020-08-02: 10 mg via INTRAVENOUS
  Filled 2020-08-02: qty 5

## 2020-08-02 MED ORDER — ONDANSETRON HCL 4 MG/2ML IJ SOLN
4.0000 mg | Freq: Once | INTRAMUSCULAR | Status: AC
  Administered 2020-08-02: 4 mg via INTRAVENOUS
  Filled 2020-08-02: qty 2

## 2020-08-02 MED ORDER — IOHEXOL 300 MG/ML  SOLN
100.0000 mL | Freq: Once | INTRAMUSCULAR | Status: AC | PRN
  Administered 2020-08-02: 100 mL via INTRAVENOUS

## 2020-08-02 MED ORDER — SODIUM CHLORIDE 0.9 % IV SOLN: INTRAVENOUS | Status: DC

## 2020-08-02 MED ORDER — LOPERAMIDE HCL 2 MG PO CAPS: 2.0000 mg | ORAL_CAPSULE | Freq: Four times a day (QID) | ORAL | 0 refills | Status: AC | PRN

## 2020-08-02 NOTE — ED Notes (Signed)
RT called, patient SAT 88% on RA. Placed on [facility] and helped patient reposition. SAT now 94%

## 2020-08-02 NOTE — ED Notes (Signed)
Pt stated that he was doing fine and that he was hungry

## 2020-08-02 NOTE — Discharge Instructions (Addendum)
Take the medications as needed for vomiting and diarrhea.  Follow-up with your primary care doctor or GI doctor for further evaluation if the symptoms persist.

## 2020-08-02 NOTE — ED Notes (Signed)
Patient transported to CT 

## 2020-08-02 NOTE — ED Triage Notes (Signed)
Pt c/o n/v/d since 330am-NAD-to triage in w/c

## 2020-08-02 NOTE — ED Notes (Signed)
Pt at 88% with good pleth. RT alled placed on 2 L [place]

## 2020-08-02 NOTE — ED Notes (Addendum)
Pt ambulated from rm 3 to rm 6 and turned around and ambulated back to rm 3. Pt denies any sob, dizziness, nausea or pain. Pt ambulated with a easy gate. Pt resp rate was about 14. No pulse ox was needed.

## 2020-08-02 NOTE — ED Notes (Signed)
Pt reported that tightness in his chest  A 3 out of 10. Pain subsided after approx 4 minutes. Pt connected to 12 lead and MD notified; EKG obtained, Bolus decreased to 500 mL.

## 2020-08-02 NOTE — ED Provider Notes (Signed)
[facility] EMERGENCY DEPARTMENT Provider Note   CSN: [id number] Arrival date & time: 08/02/20  1424     History Chief Complaint  Patient presents with  . Diarrhea    Ricardo Norton is a 75 y.o. male.  HPI   Pt presents with vomiting and diarrhea.  Pt states sx started early this am.  Since then he has had multiple episodes of vomiting and diarrhea.   Greater than 10 episodes of each.  He is unable to keep anything down.  He is feeling lightheaded and weak.  No abd pain.  No fevers.  No blood.  States he has had episodes like this in the past.  Has seen GI.  Have not been able to figure out the cause.  Last episode was last year in October.  Past Medical History:  Diagnosis Date  . Arthritis   . Cataract   . Depression   . Diabetes ([place])   . Diverticulosis   . Exposure to [employer]   . 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 GERD (gastroesophageal reflux disease)   . Hiatal hernia   . Hyperlipidemia   . Hypothyroid   . Prostatitis   . PTSD (post-traumatic stress disorder)   . Pulmonary nodule   . Sleep apnea     Patient Active Problem List   Diagnosis Date Noted  . Abdominal pain   . History of gastrointestinal stromal tumor (GIST)   . Gastritis and gastroduodenitis     Past Surgical History:  Procedure Laterality Date  . APPENDECTOMY    . EUS N/A 10/17/2016   Procedure: UPPER ENDOSCOPIC ULTRASOUND (EUS) LINEAR;  Surgeon: Milus Banister, MD;  Location: [facility] ENDOSCOPY;  Service: Endoscopy;  Laterality: N/A;  . gist tumor excision- stomach  2015       Family History  Problem Relation Age of Onset  . Bladder Cancer Mother     Social History   Tobacco Use  . Smoking status: Former Smoker    Quit date: 1997    Years since quitting: 25.1  . Smokeless tobacco: Never Used  Vaping Use  . Vaping Use: Never used  Substance Use Topics  . Alcohol use: Not Currently  . Drug use: Not Currently    Home Medications Prior to Admission  medications   Medication Sig Start Date End Date Taking? Authorizing Provider  atorvastatin (LIPITOR) 80 MG tablet Take 40 mg by mouth at bedtime.   Yes [provider]  buPROPion (WELLBUTRIN SR) 150 MG 12 hr tablet Take 300 mg by mouth daily.   Yes [provider]  levothyroxine (SYNTHROID, LEVOTHROID) 25 MCG tablet Take 25 mcg by mouth at bedtime.   Yes [provider]  lisinopril (PRINIVIL,ZESTRIL) 40 MG tablet Take 20 mg by mouth at bedtime.   Yes [provider]  loperamide (IMODIUM) 2 MG capsule Take 1 capsule (2 mg total) by mouth 4 (four) times daily as needed for diarrhea or loose stools. 08/02/20  Yes Dorie Rank, MD  metFORMIN (GLUCOPHAGE) 500 MG tablet Take 500 mg by mouth 2 (two) times daily with a meal.   Yes [provider]  Multiple Vitamin (MULTIVITAMIN WITH MINERALS) TABS tablet Take 1 tablet by mouth daily.   Yes [provider]  Omega-3 Fatty Acids (OMEGA-3 PO) Take 2 capsules by mouth at bedtime.   Yes [provider]  omeprazole (PRILOSEC) 20 MG capsule Take 20 mg by mouth 2 (two) times daily.   Yes [provider]  ondansetron (ZOFRAN ODT) 8 MG disintegrating tablet Take 1 tablet (8 mg total) by mouth every 8 (eight) hours as needed for nausea or vomiting. 08/02/20  Yes Dorie Rank, MD  PARoxetine (PAXIL) 40 MG tablet Take 60 mg by mouth daily.    Yes [provider]  primidone (MYSOLINE) 50 MG tablet Take 150 mg by mouth daily.   Yes [provider]  propranolol (INDERAL) 20 MG tablet Take 1 tablet (20 mg total) by mouth 2 (two) times daily. 04/24/20  Yes Tat, Eustace Quail, DO  ranitidine (ZANTAC) 150 MG tablet Take 150 mg by mouth 2 (two) times daily.    [provider]    Allergies    Prazosin  Review of Systems   Review of Systems  All other systems reviewed and are negative.   Physical Exam Updated Vital Signs BP 129/76   Pulse (!) 113   Temp 97.7 F (36.5 C) (Oral)    Resp 18   Ht 1.727 m (5\' 8" )   Wt 89.8 kg   SpO2 91%   BMI 30.11 kg/m   Physical Exam Vitals and nursing note reviewed.  Constitutional:      Appearance: He is well-developed and well-nourished. He is ill-appearing. He is not diaphoretic.  HENT:     Head: Normocephalic and atraumatic.     Right Ear: External ear normal.     Left Ear: External ear normal.  Eyes:     General: No scleral icterus.       Right eye: No discharge.        Left eye: No discharge.     Conjunctiva/sclera: Conjunctivae normal.  Neck:     Trachea: No tracheal deviation.  Cardiovascular:     Rate and Rhythm: Regular rhythm. Tachycardia present.     Pulses: Intact distal pulses.  Pulmonary:     Effort: Pulmonary effort is normal. No respiratory distress.     Breath sounds: Normal breath sounds. No stridor. No wheezing or rales.  Abdominal:     General: Bowel sounds are normal. There is no distension.     Palpations: Abdomen is soft.     Tenderness: There is no abdominal tenderness. There is no guarding or rebound.  Musculoskeletal:        General: No tenderness or edema.     Cervical back: Neck supple.  Skin:    General: Skin is warm and dry.     Findings: No rash.  Neurological:     Mental Status: He is alert.     Cranial Nerves: No cranial nerve deficit (no facial droop, extraocular movements intact, no slurred speech).     Sensory: No sensory deficit.     Motor: No abnormal muscle tone or seizure activity.     Coordination: Coordination normal.     Deep Tendon Reflexes: Strength normal.  Psychiatric:        Mood and Affect: Mood and affect normal.     ED Results / Procedures / Treatments   Labs (all labs ordered are listed, but only abnormal results are displayed) Labs Reviewed  COMPREHENSIVE METABOLIC PANEL - Abnormal; Notable for the following components:      Result Value   Glucose, Bld 298 (*)    Creatinine, Ser 1.42 (*)    Total Protein 9.2 (*)    GFR, Estimated 52 (*)    Anion  gap 16 (*)    All other components within normal limits  CBC WITH DIFFERENTIAL/PLATELET - Abnormal; Notable  for the following components:   WBC 16.6 (*)    Neutro Abs 15.6 (*)    Lymphs Abs 0.5 (*)    Abs Immature Granulocytes 0.10 (*)    All other components within normal limits  URINALYSIS, ROUTINE W REFLEX MICROSCOPIC - Abnormal; Notable for the following components:   Color, Urine ORANGE (*)    Bilirubin Urine SMALL (*)    Ketones, ur 40 (*)    Protein, ur 30 (*)    Leukocytes,Ua TRACE (*)    All other components within normal limits  URINALYSIS, MICROSCOPIC (REFLEX) - Abnormal; Notable for the following components:   Bacteria, UA FEW (*)    All other components within normal limits  LIPASE, BLOOD  TROPONIN I (HIGH SENSITIVITY)  TROPONIN I (HIGH SENSITIVITY)    EKG EKG Interpretation  Date/Time:  Wednesday August 02 2020 19:34:42 EST Ventricular Rate:  122 PR Interval:    QRS Duration: 75 QT Interval:  303 QTC Calculation: 432 R Axis:   168 Text Interpretation: Sinus tachycardia Abnormal R-wave progression, late transition Inferior infarct, old tachycardia persists compared to prior ecg Confirmed by Dorie Rank [phone]) on 08/02/2020 7:41:11 PM   Radiology CT ABDOMEN PELVIS W CONTRAST  Result Date: 08/02/2020 CLINICAL DATA:  Nausea, vomiting and diarrhea EXAM: CT ABDOMEN AND PELVIS WITH CONTRAST TECHNIQUE: Multidetector CT imaging of the abdomen and pelvis was performed using the standard protocol following bolus administration of intravenous contrast. CONTRAST:  169mL OMNIPAQUE IOHEXOL 300 MG/ML  SOLN COMPARISON:  None. FINDINGS: LOWER CHEST: Normal. HEPATOBILIARY: Normal hepatic contours. No intra- or extrahepatic biliary dilatation. The gallbladder is normal. PANCREAS: Normal pancreas. No ductal dilatation or peripancreatic fluid collection. SPLEEN: Normal. ADRENALS/URINARY TRACT: The adrenal glands are normal. 6.2 cm left renal cyst. The urinary bladder is normal for  degree of distention STOMACH/BOWEL: There is no hiatal hernia. Normal duodenal course and caliber. No small bowel dilatation or inflammation. Rectosigmoid diverticulosis without acute inflammation. Normal appendix. VASCULAR/LYMPHATIC: There is calcific atherosclerosis of the abdominal aorta. No lymphadenopathy. REPRODUCTIVE: Normal prostate size with symmetric seminal vesicles. MUSCULOSKELETAL. No bony spinal canal stenosis or focal osseous abnormality. OTHER: None. IMPRESSION: 1. No acute abnormality of the abdomen or pelvis. 2. Rectosigmoid diverticulosis without acute inflammation. Aortic Atherosclerosis (ICD10-I70.0). Electronically Signed   By: Ulyses Jarred M.D.   On: 08/02/2020 20:43   DG Chest Portable 1 View  Result Date: 08/02/2020 CLINICAL DATA:  Vomiting, diarrhea, short of breath, chest tightness EXAM: PORTABLE CHEST 1 VIEW COMPARISON:  11/09/2019 FINDINGS: The heart size and mediastinal contours are within normal limits. Both lungs are clear. The visualized skeletal structures are unremarkable. IMPRESSION: No active disease. Electronically Signed   By: Randa Ngo M.D.   On: 08/02/2020 17:07    Procedures Procedures   Medications Ordered in ED Medications  sodium chloride 0.9 % bolus 1,000 mL (0 mLs Intravenous Stopped 08/02/20 1716)    And  sodium chloride 0.9 % bolus 1,000 mL (0 mLs Intravenous Stopped 08/02/20 1719)    And  0.9 %  sodium chloride infusion ( Intravenous New Bag/Given 08/02/20 1720)  ondansetron (ZOFRAN) injection 4 mg (4 mg Intravenous Given 08/02/20 1505)  loperamide (IMODIUM) capsule 4 mg (4 mg Oral Given 08/02/20 1519)  diltiazem (CARDIZEM) injection 10 mg (10 mg Intravenous Given 08/02/20 1945)  ondansetron (ZOFRAN) injection 4 mg (4 mg Intravenous Given 08/02/20 1956)  iohexol (OMNIPAQUE) 300 MG/ML solution 100 mL (100 mLs Intravenous Contrast Given 08/02/20 2012)    ED Course  I have reviewed  the triage vital signs and the nursing notes.  Pertinent labs &  imaging results that were available during my care of the patient were reviewed by me and considered in my medical decision making (see chart for details).  Clinical Course as of 08/02/20 2124  Wed Aug 02, 2020  1510 REviewed notes from visit in October, as well as CT scan [JK]  1551 Notified pt had an episode of chest pain while here. [JK]  1630 Creatinine is increased compared to previous. [MA]  2633 Patient's white blood cell count is elevated. Lipase normal. LFTs unremarkable. [JK]  1910 Trops normal [JK]  1911 Chest x-ray without acute finding [JK]  1927 HR up into the 130s .  Will repeat ECG [JK]  2051 CT scan without acute finding [JK]  2124 Heart rate improved.  Down to 100 at the bedside.  Patient was able to ambulate around the ED without difficulty.  He has been able to tolerate fluids. [JK]    Clinical Course User Index [JK] Dorie Rank, MD   MDM Rules/Calculators/A&P                          Patient presented to ED for evaluation of vomiting and diarrhea.  Patient was initially tachycardic and that persisted to some extent throughout his ED stay.  Laboratory test did show the signs of mild dehydration.  Patient was treated with IV fluids.  He was also given antiemetics.  Symptoms improved while he was in the ED.  He was able to tolerate fluids.  No recurrent vomiting or diarrhea.  Patient did have an episode of chest discomfort while he was here so chest x-ray and EKG were performed.  Patient also did have a troponin which was normal.  With his tachycardia and elevated white blood cell count CT scan was performed and no acute findings were noted.  Patient improved during his ED stays.  Heart rate decreased back to his baseline rate.  I think he most likely has a viral gastroenteritis.  Will discharge home with medications for nausea and diarrhea.  Discussed outpatient follow-up with PCP. Final Clinical Impression(s) / ED Diagnoses Final diagnoses:  Nausea vomiting and diarrhea     Rx / DC Orders ED Discharge Orders         Ordered    ondansetron (ZOFRAN ODT) 8 MG disintegrating tablet  Every 8 hours PRN        08/02/20 2123    loperamide (IMODIUM) 2 MG capsule  4 times daily PRN        08/02/20 2123           Dorie Rank, MD 08/02/20 2126

## 2020-10-04 NOTE — Progress Notes (Signed)
Assessment/Plan:    1.  Essential Tremor  -Patient doing well on primidone, 50 mg, and he is only taking 1 tablet at night.  He will continue on that.   2.  Severe L5-S1 facet arthrosis  -Currently asymptomatic.  Was noted on scans for hyperreflexia.  Patient declined referral to neurosurgery.  Will monitor.  3.  Patient doing so well that we decided to discharge him back to the full care of his primary care physician.  He can certainly let us know if he has issues in the future. Subjective:   Ricardo Norton was seen today in follow up for essential tremor.  My previous records were reviewed prior to todays visit. This patient is accompanied in the office by his spouse who supplements the history.  Pt reports that tremor has been doing really well.  He states that he has not done this well in a long time.  I actually had increased his primidone last visit, and he states he did not need to do that and he is actually only on 1 tablet daily (I thought he was now on 3 tablets daily).  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  We did an MRI of the lumbar spine since last visit.  I personally reviewed that.  He did have severe arthrosis at L5-S1, but that would not have been responsible for his hyperreflexia (the reason we did the scan).  He really did not want to be referred to neurosurgery and we just decided to take a wait-and-see approach regarding clinical symptoms.  Pt states that it feels "really good" right now.  Current prescribed movement disorder medications: Primidone, 50 mg, 2 tablets in the morning and 1 tablet at night (previously on 3 tablets at bedtime) - pt only taking one per day    ALLERGIES:   Allergies  Allergen Reactions  . Prazosin Other (See Comments)    CURRENT MEDICATIONS:  Outpatient Encounter Medications as of 10/06/2020  Medication Sig  . atorvastatin (LIPITOR) 80 MG tablet Take 40 mg by mouth at bedtime.  Marland Kitchen buPROPion (WELLBUTRIN SR)  150 MG 12 hr tablet Take 300 mg by mouth daily.  Marland Kitchen levothyroxine (SYNTHROID, LEVOTHROID) 25 MCG tablet Take 25 mcg by mouth at bedtime.  Marland Kitchen lisinopril (PRINIVIL,ZESTRIL) 40 MG tablet Take 20 mg by mouth at bedtime.  Marland Kitchen loperamide (IMODIUM) 2 MG capsule Take 1 capsule (2 mg total) by mouth 4 (four) times daily as needed for diarrhea or loose stools.  . metFORMIN (GLUCOPHAGE) 500 MG tablet Take 500 mg by mouth 2 (two) times daily with a meal.  . Multiple Vitamin (MULTIVITAMIN WITH MINERALS) TABS tablet Take 1 tablet by mouth daily.  Marland Kitchen omeprazole (PRILOSEC) 20 MG capsule Take 20 mg by mouth 2 (two) times daily.  . ondansetron (ZOFRAN ODT) 8 MG disintegrating tablet Take 1 tablet (8 mg total) by mouth every 8 (eight) hours as needed for nausea or vomiting.  Marland Kitchen PARoxetine (PAXIL) 40 MG tablet Take 60 mg by mouth daily.   . primidone (MYSOLINE) 50 MG tablet Take 150 mg by mouth daily.  . propranolol (INDERAL) 20 MG tablet Take 1 tablet (20 mg total) by mouth 2 (two) times daily.  . Omega-3 Fatty Acids (OMEGA-3 PO) Take 2 capsules by mouth at bedtime. (Patient not taking: Reported on 10/06/2020)  . ranitidine (ZANTAC) 150 MG tablet Take 150 mg by mouth 2 (two) times daily. (Patient not taking: Reported on  10/06/2020)   No facility-administered encounter medications on file as of 10/06/2020.     Objective:    PHYSICAL EXAMINATION:    VITALS:   Vitals:   10/06/20 1114  BP: 118/82  Pulse: 100  SpO2: 95%  Weight: 199 lb (90.3 kg)  Height: 5\' 8"  (1.727 m)    GEN:  The patient appears stated age and is in NAD. HEENT:  Normocephalic, atraumatic.  The mucous membranes are moist.   Neurological examination:  Orientation: The patient is alert and oriented x3. Cranial nerves: There is good facial symmetry. The speech is fluent and clear. Soft palate rises symmetrically and there is no tongue deviation. Hearing is intact to conversational tone. Sensation: Sensation is intact to light touch  throughout Motor: Strength is at least antigravity x4.  Movement examination: Tone: There is normal tone in the UE/LE Abnormal movements: No rest tremor.  Very little postural tremor.  Archimedes spirals are markedly better than in November. Coordination:  There is no decremation with RAM's Gait and Station: The patient ambulates well in the hall I have reviewed and interpreted the following labs independently   Chemistry      Component Value Date/Time   NA 137 08/02/2020 1500   K 4.5 08/02/2020 1500   CL 98 08/02/2020 1500   CO2 23 08/02/2020 1500   BUN 22 08/02/2020 1500   CREATININE 1.42 (H) 08/02/2020 1500      Component Value Date/Time   CALCIUM 9.9 08/02/2020 1500   ALKPHOS 115 08/02/2020 1500   AST 31 08/02/2020 1500   ALT 35 08/02/2020 1500   BILITOT 0.9 08/02/2020 1500      Lab Results  Component Value Date   WBC 16.6 (H) 08/02/2020   HGB 16.7 08/02/2020   HCT 47.7 08/02/2020   MCV 92.1 08/02/2020   PLT 298 08/02/2020   No results found for: TSH   Chemistry      Component Value Date/Time   NA 137 08/02/2020 1500   K 4.5 08/02/2020 1500   CL 98 08/02/2020 1500   CO2 23 08/02/2020 1500   BUN 22 08/02/2020 1500   CREATININE 1.42 (H) 08/02/2020 1500      Component Value Date/Time   CALCIUM 9.9 08/02/2020 1500   ALKPHOS 115 08/02/2020 1500   AST 31 08/02/2020 1500   ALT 35 08/02/2020 1500   BILITOT 0.9 08/02/2020 1500         Total time spent on today's visit was 20 minutes, including both face-to-face time and nonface-to-face time.  Time included that spent on review of records (prior notes available to me/labs/imaging if pertinent), discussing treatment and goals, answering patient's questions and coordinating care.  Cc:  Jefm Petty, MD

## 2020-10-06 ENCOUNTER — Encounter: Payer: Self-pay | Admitting: Neurology

## 2020-10-06 ENCOUNTER — Ambulatory Visit (INDEPENDENT_AMBULATORY_CARE_PROVIDER_SITE_OTHER): Payer: No Typology Code available for payment source | Admitting: Neurology

## 2020-10-06 ENCOUNTER — Other Ambulatory Visit: Payer: Self-pay

## 2020-10-06 VITALS — BP 118/82 | HR 100 | Ht 68.0 in | Wt 199.0 lb

## 2020-10-06 DIAGNOSIS — G250 Essential tremor: Secondary | ICD-10-CM

## 2021-02-22 ENCOUNTER — Telehealth: Payer: Self-pay | Admitting: Neurology

## 2021-02-22 ENCOUNTER — Other Ambulatory Visit: Payer: Self-pay | Admitting: Neurology

## 2021-02-22 ENCOUNTER — Other Ambulatory Visit: Payer: Self-pay

## 2021-02-22 DIAGNOSIS — G250 Essential tremor: Secondary | ICD-10-CM

## 2021-02-22 NOTE — Telephone Encounter (Signed)
Pt said he needs a refill sent for his propanolol. Tat was supposed to send a prescription to his doctor. Jefm Petty.

## 2022-04-24 IMAGING — CT CT ABD-PELV W/ CM
2 of 5 series · 16 of 46 positions shown, 18 images · IV contrast (Omnipaque)
Comparison: CT abdomen pelvis dated 10/21/2018.

CLINICAL DATA: 73-year-old male with acute abdominal pain.

EXAM:
CT ABDOMEN AND PELVIS WITH CONTRAST
TECHNIQUE: Multidetector CT imaging of the abdomen and pelvis was performed
using the standard protocol following bolus administration of
intravenous contrast.
CONTRAST:  100mL OMNIPAQUE IOHEXOL 300 MG/ML  SOLN

[Series 2: axial st · axial · 0.85mm/px · z∈[+806,+1226]mm · 13 of 96 slices shown, 15 images]
[im 6/96  soft-tissue]
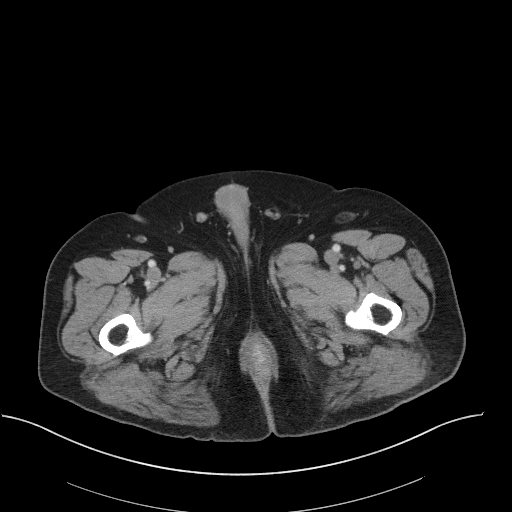
[im 6/96  bone]
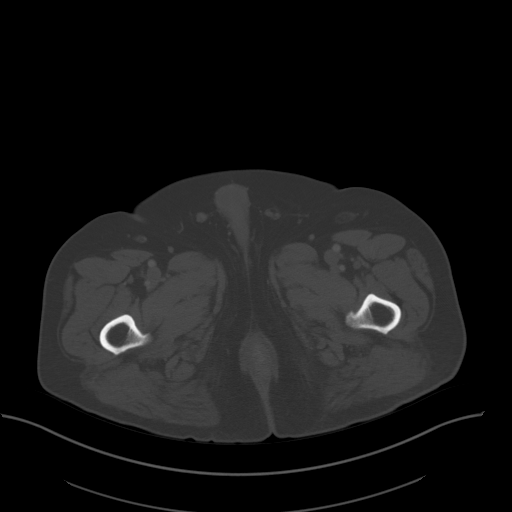
[im 11/96  soft-tissue]
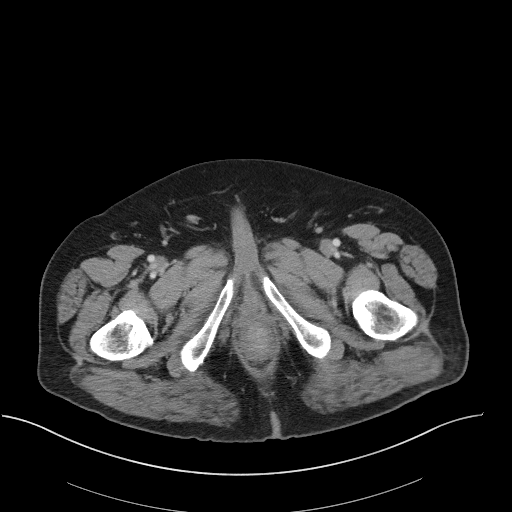
[im 22/96  soft-tissue]
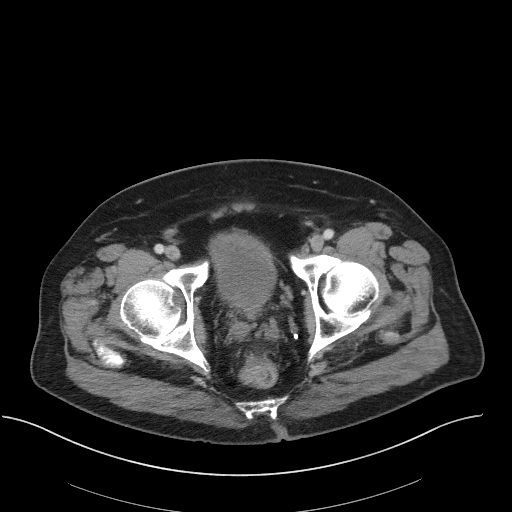
[im 27/96  soft-tissue]
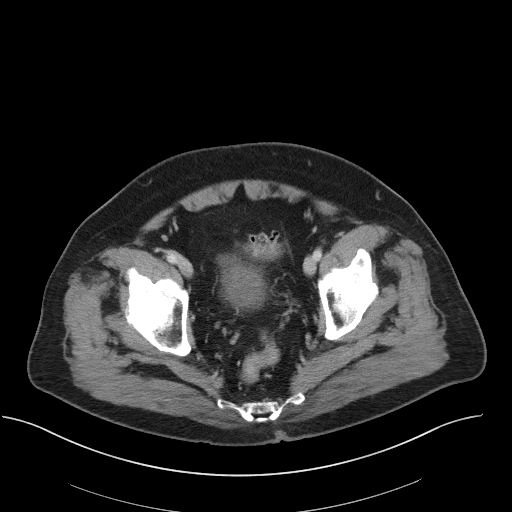
[im 32/96  soft-tissue]
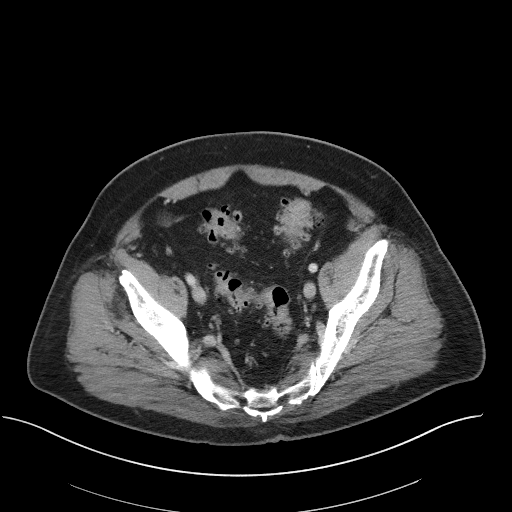
[im 43/96  soft-tissue]
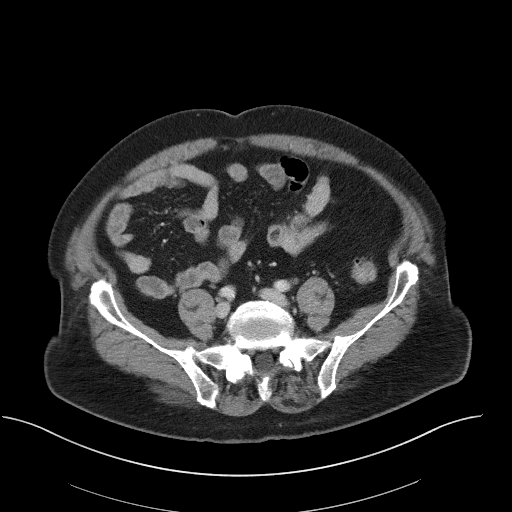
[im 48/96  soft-tissue]
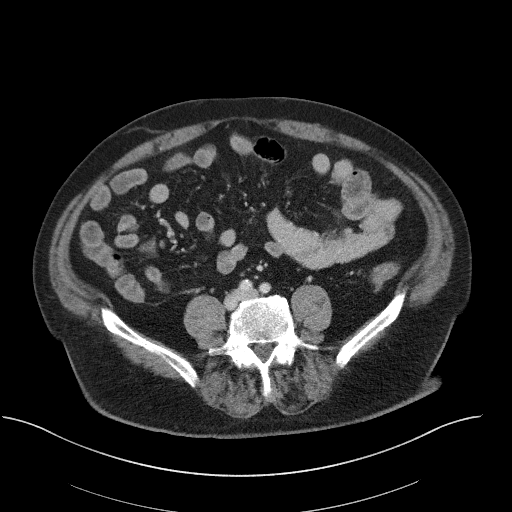
[im 53/96  soft-tissue]
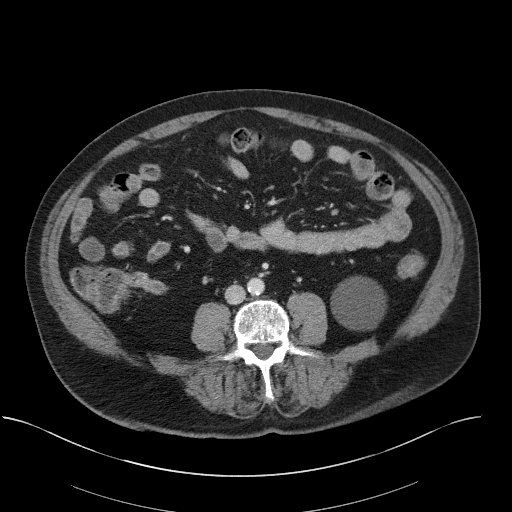
[im 64/96  soft-tissue]
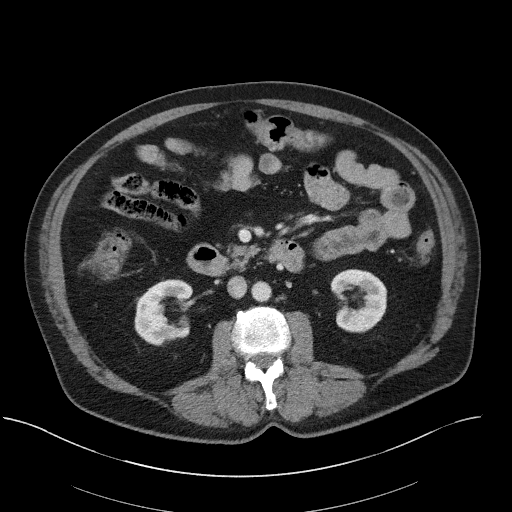
[im 64/96  bone]
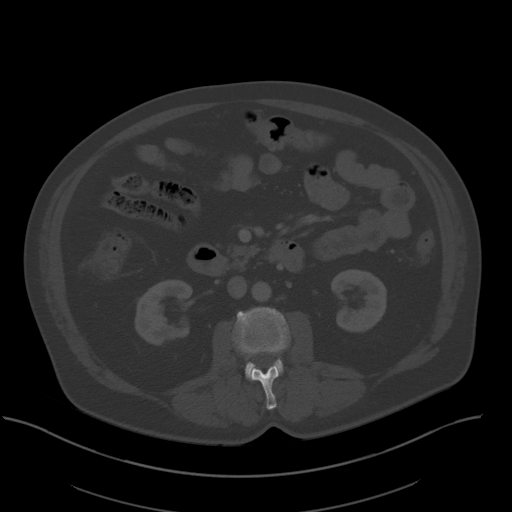
[im 69/96  soft-tissue]
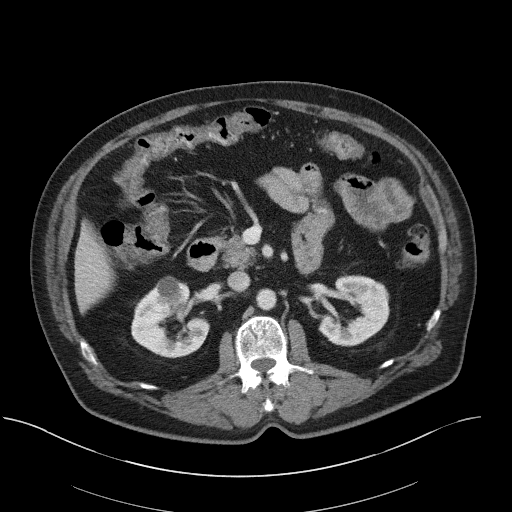
[im 74/96  soft-tissue]
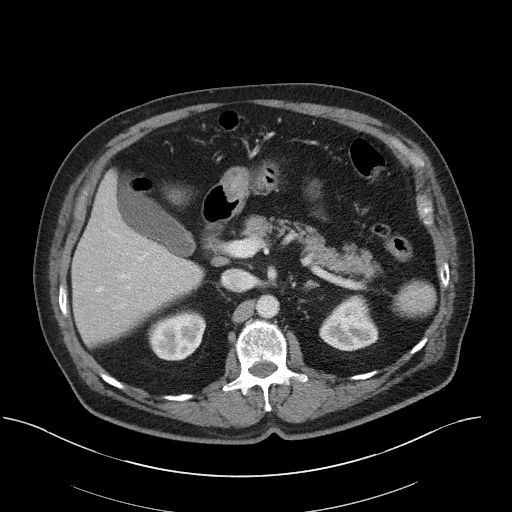
[im 85/96  soft-tissue]
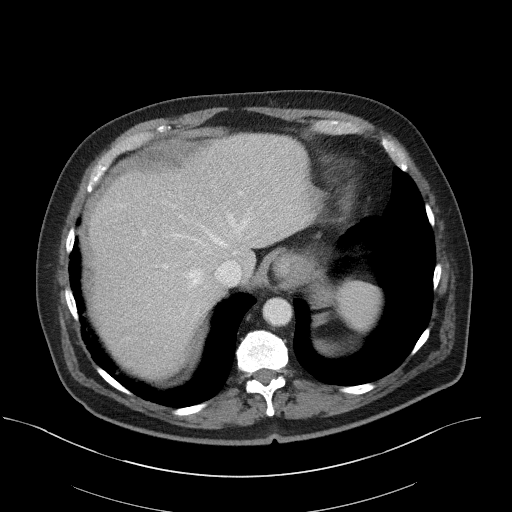
[im 90/96  soft-tissue]
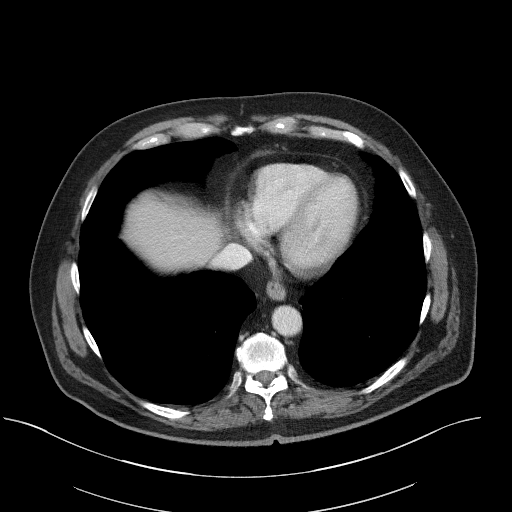

[Series 5: coronal st · coronal · 0.87mm/px · 3 of 111 slices shown]
[im 37/111  soft-tissue]
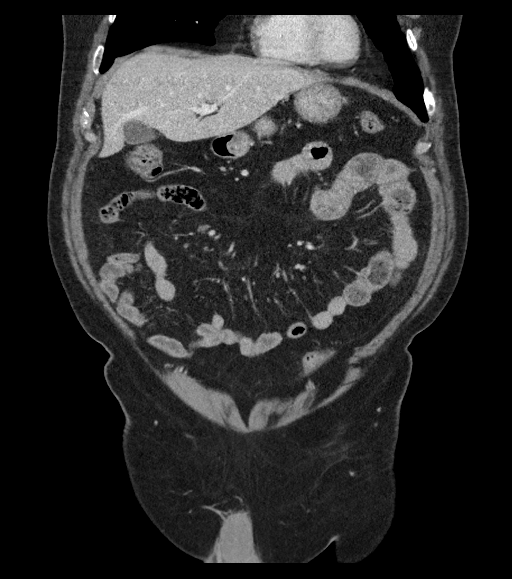
[im 49/111  soft-tissue]
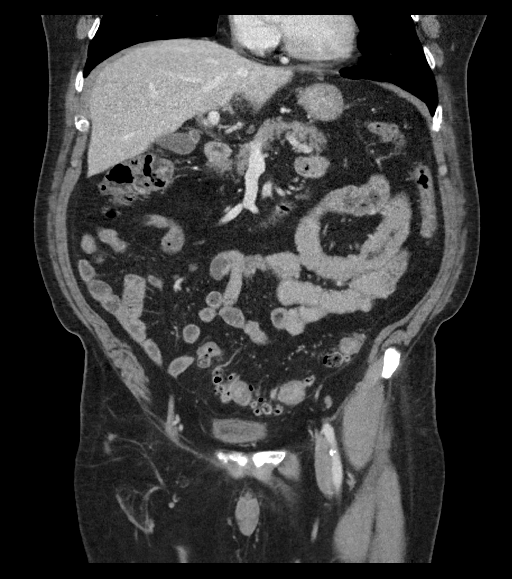
[im 62/111  soft-tissue]
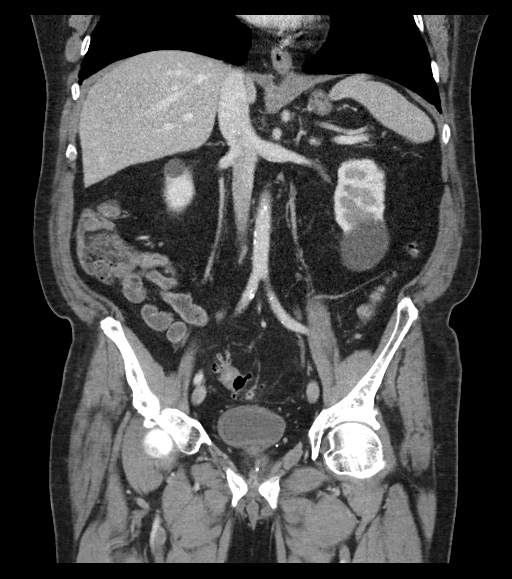

[16 of 46 positions shown; findings below may reference images not displayed]

FINDINGS: Lower chest: There is an 8 mm nodule at the left lung base seen on
the prior CT and reported on the chest CT of 02/02/2018. The
visualized lung bases are otherwise clear.

No intra-abdominal free air or free fluid.

Hepatobiliary: Subcentimeter hypodense focus in the dome of the
liver so small to characterize. The liver is otherwise unremarkable.
No intrahepatic biliary ductal dilatation. No calcified gallstone or
pericholecystic fluid.

Pancreas: Unremarkable. No pancreatic ductal dilatation or
surrounding inflammatory changes.

Spleen: Normal in size without focal abnormality.

Adrenals/Urinary Tract: The adrenal glands unremarkable. Multiple
bilateral renal cysts measuring up to 5 cm in the inferior pole of
the left kidney. Several subcentimeter hypodensities are too small
to characterize. There is no hydronephrosis on either side. There is
symmetric enhancement and excretion of contrast by both kidneys. The
visualized ureters and urinary bladder appear unremarkable.

Stomach/Bowel: There is severe sigmoid diverticulosis with muscular
hypertrophy. No acute inflammatory changes. There is no bowel
obstruction or active inflammation. Appendectomy.

Vascular/Lymphatic: Mild aortoiliac atherosclerotic disease. The IVC
is unremarkable. No portal venous gas. There is no adenopathy.

Reproductive: The prostate and seminal vesicles are grossly
unremarkable. No pelvic mass.

Other: None

Musculoskeletal: No acute or significant osseous findings.
IMPRESSION: 1. No acute intra-abdominal or pelvic pathology.
2. Severe sigmoid diverticulosis. No bowel obstruction.
3. Aortic Atherosclerosis (CULSP-SS3.3).

## 2022-09-07 IMAGING — CT CT ABD-PELV W/ CM
2 of 5 series · 16 of 46 positions shown, 18 images · IV contrast (Omnipaque)
Comparison: None.

CLINICAL DATA: Nausea, vomiting and diarrhea

EXAM:
CT ABDOMEN AND PELVIS WITH CONTRAST
TECHNIQUE: Multidetector CT imaging of the abdomen and pelvis was performed
using the standard protocol following bolus administration of
intravenous contrast.
CONTRAST:  100mL OMNIPAQUE IOHEXOL 300 MG/ML  SOLN

[Series 2: axial st · axial · 0.80mm/px · z∈[+457,+902]mm · 13 of 101 slices shown, 15 images]
[im 6/101  soft-tissue]
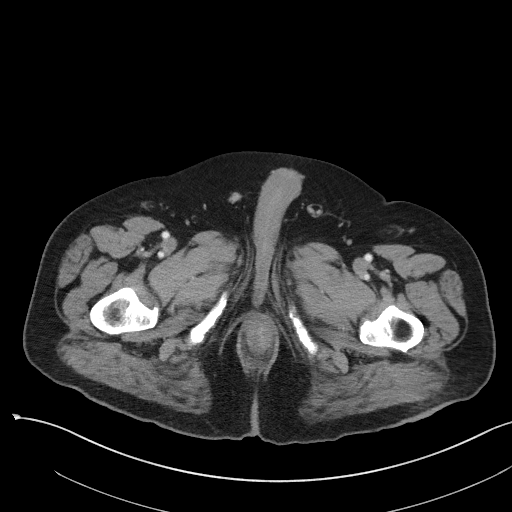
[im 6/101  bone]
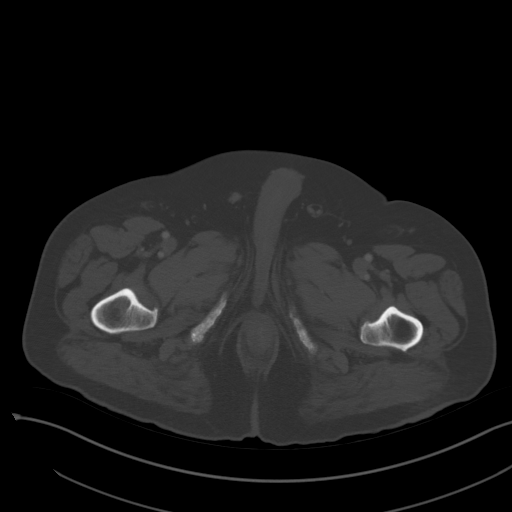
[im 16/101  soft-tissue]
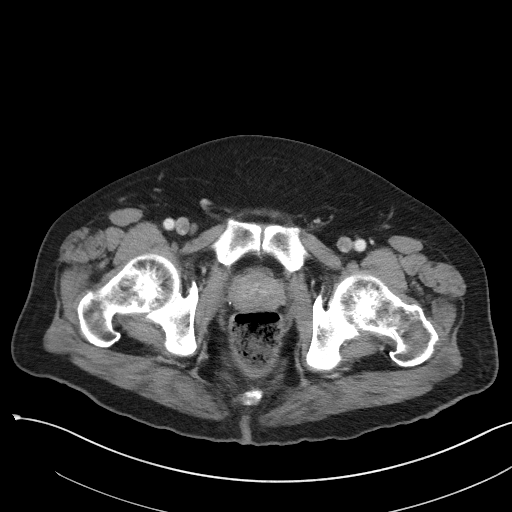
[im 22/101  soft-tissue]
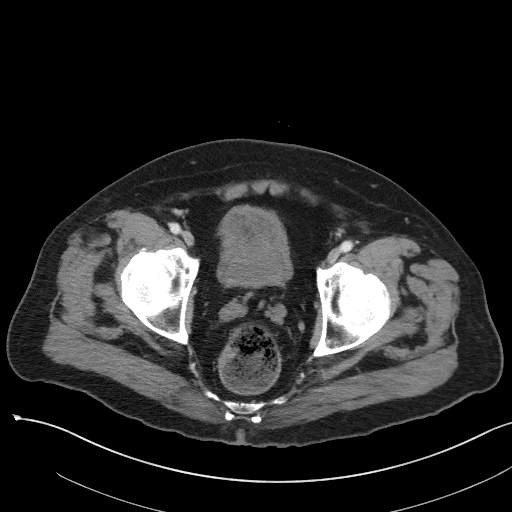
[im 27/101  soft-tissue]
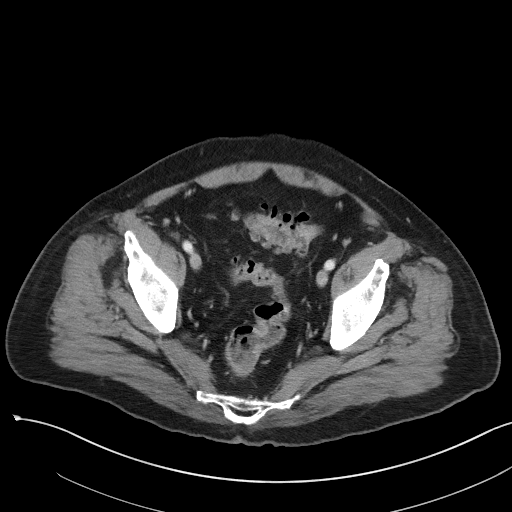
[im 37/101  soft-tissue]
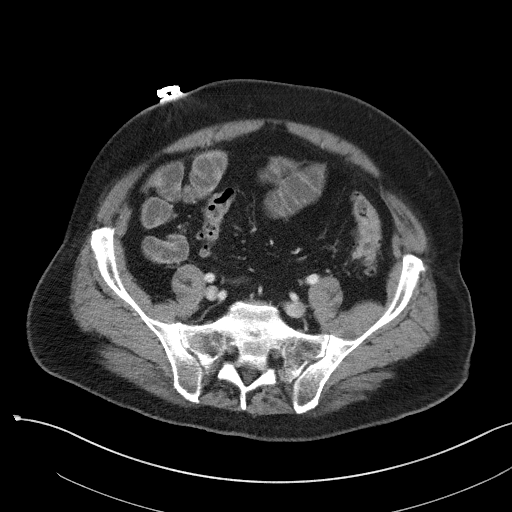
[im 43/101  soft-tissue]
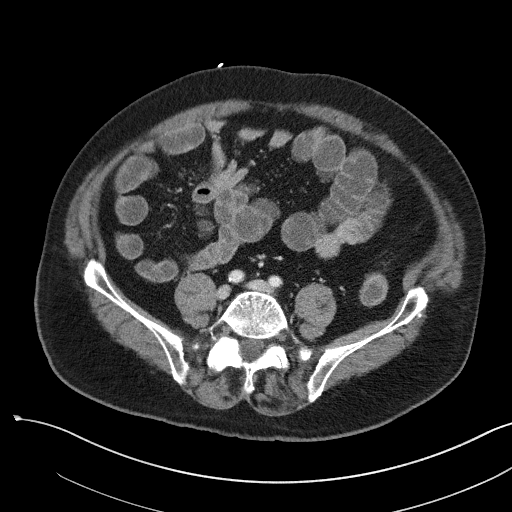
[im 53/101  soft-tissue]
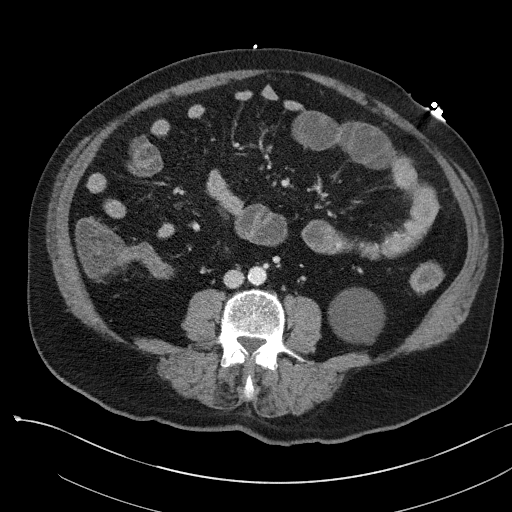
[im 58/101  soft-tissue]
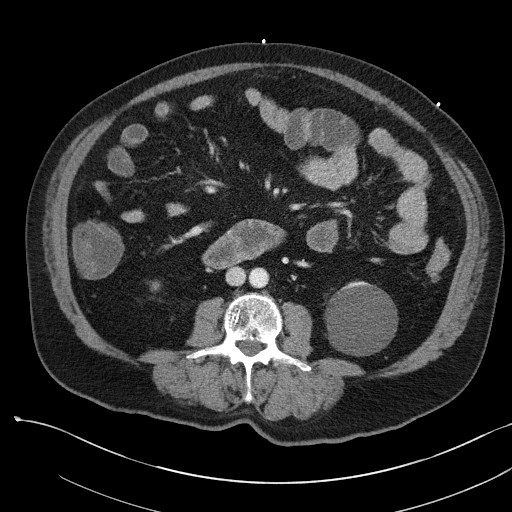
[im 64/101  soft-tissue]
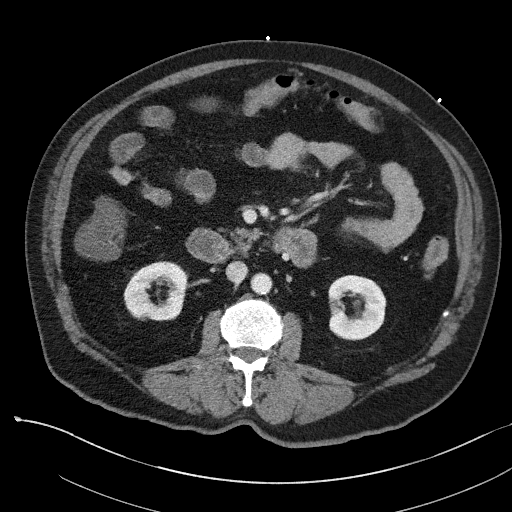
[im 64/101  bone]
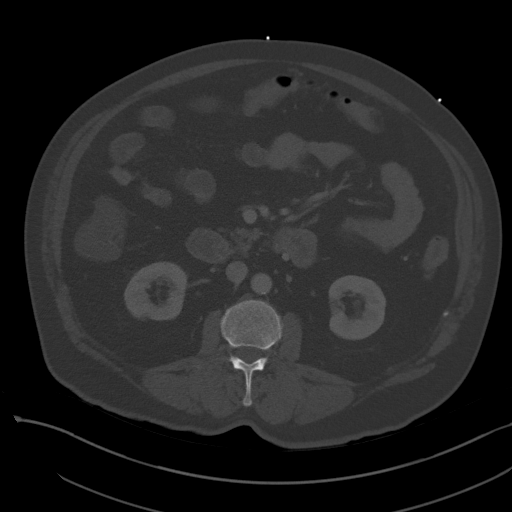
[im 74/101  soft-tissue]
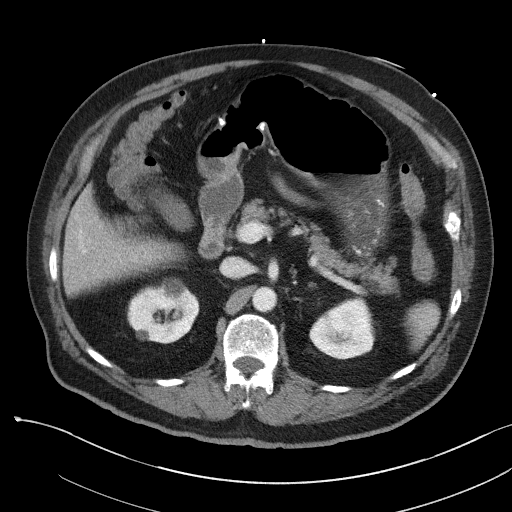
[im 79/101  soft-tissue]
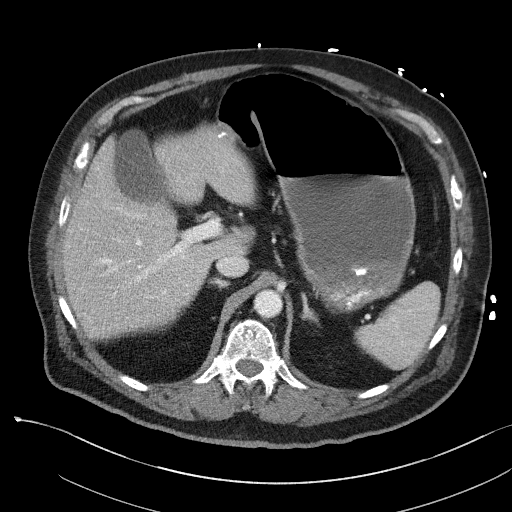
[im 85/101  soft-tissue]
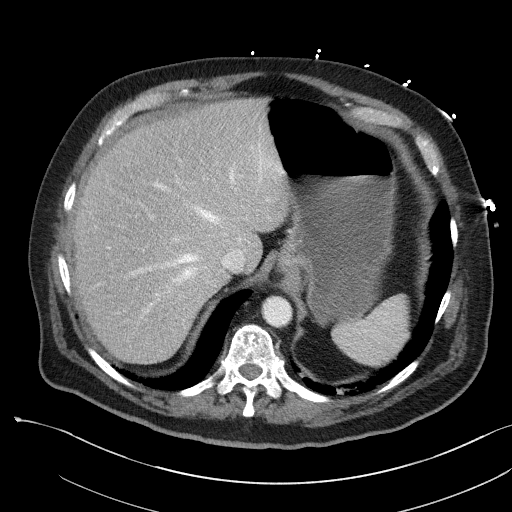
[im 95/101  soft-tissue]
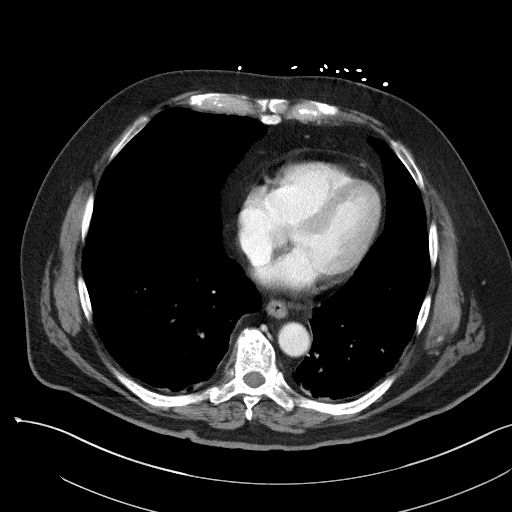

[Series 5: coronal st · coronal · 0.75mm/px · 3 of 106 slices shown]
[im 36/106  soft-tissue]
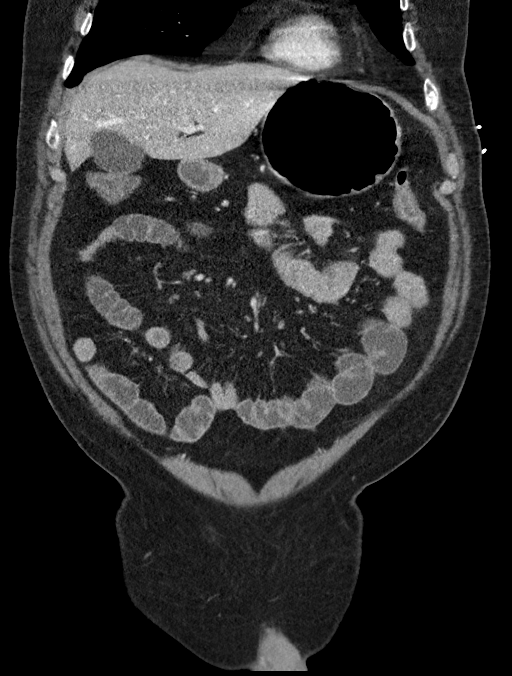
[im 47/106  soft-tissue]
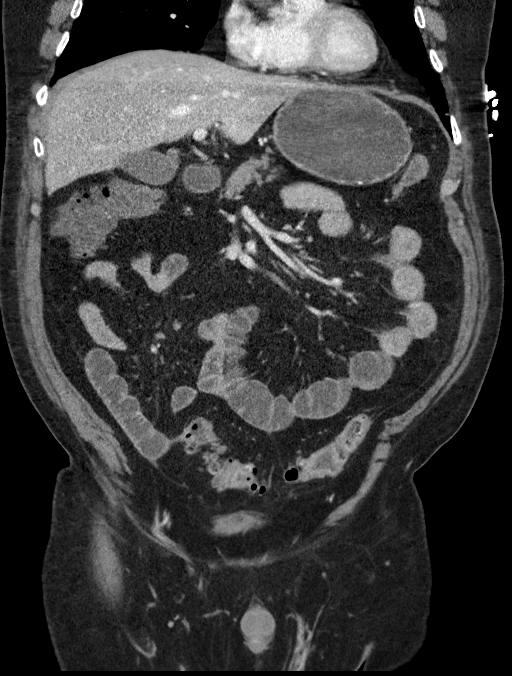
[im 59/106  soft-tissue]
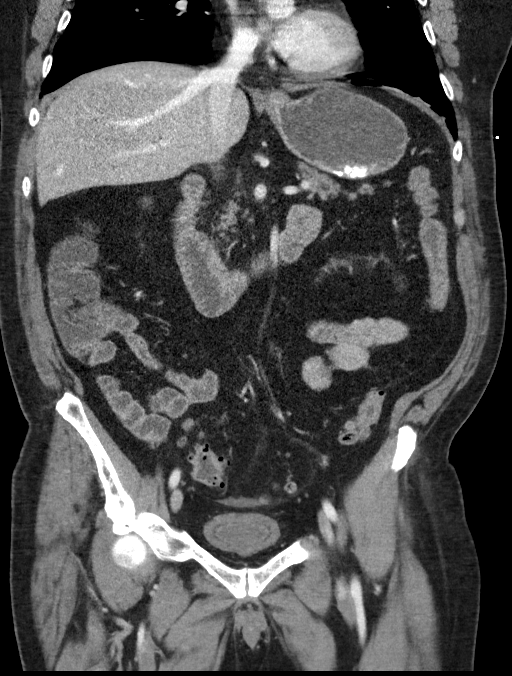

[16 of 46 positions shown; findings below may reference images not displayed]

FINDINGS: LOWER CHEST: Normal.

HEPATOBILIARY: Normal hepatic contours. No intra- or extrahepatic
biliary dilatation. The gallbladder is normal.

PANCREAS: Normal pancreas. No ductal dilatation or peripancreatic
fluid collection.

SPLEEN: Normal.

ADRENALS/URINARY TRACT: The adrenal glands are normal. 6.2 cm left
renal cyst. The urinary bladder is normal for degree of distention

STOMACH/BOWEL: There is no hiatal hernia. Normal duodenal course and
caliber. No small bowel dilatation or inflammation. Rectosigmoid
diverticulosis without acute inflammation. Normal appendix.

VASCULAR/LYMPHATIC: There is calcific atherosclerosis of the
abdominal aorta. No lymphadenopathy.

REPRODUCTIVE: Normal prostate size with symmetric seminal vesicles.

MUSCULOSKELETAL. No bony spinal canal stenosis or focal osseous
abnormality.

OTHER: None.
IMPRESSION: 1. No acute abnormality of the abdomen or pelvis.
2. Rectosigmoid diverticulosis without acute inflammation.

Aortic Atherosclerosis (IYLO2-I82.2).
